# Patient Record
Sex: Female | Born: 1980 | Race: White | Hispanic: No | Marital: Married | Smoking: Never smoker
Health system: Southern US, Community
[De-identification: ages and names within clinical notes are randomized; demographics above are authoritative.]

## PROBLEM LIST (undated history)

## (undated) DIAGNOSIS — E282 Polycystic ovarian syndrome: Secondary | ICD-10-CM

## (undated) DIAGNOSIS — N809 Endometriosis, unspecified: Secondary | ICD-10-CM

## (undated) DIAGNOSIS — O3680X0 Pregnancy with inconclusive fetal viability, not applicable or unspecified: Secondary | ICD-10-CM

## (undated) HISTORY — DX: Polycystic ovarian syndrome: E28.2

## (undated) HISTORY — DX: Endometriosis, unspecified: N80.9

---
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
## 2018-05-08 ENCOUNTER — Encounter: Payer: Self-pay | Admitting: Obstetrics and Gynecology

## 2018-05-15 ENCOUNTER — Encounter: Payer: Self-pay | Admitting: Obstetrics and Gynecology

## 2018-05-15 ENCOUNTER — Other Ambulatory Visit: Payer: Self-pay

## 2018-05-15 ENCOUNTER — Ambulatory Visit (INDEPENDENT_AMBULATORY_CARE_PROVIDER_SITE_OTHER): Payer: Self-pay | Admitting: Obstetrics and Gynecology

## 2018-05-15 VITALS — BP 110/66 | HR 66 | Resp 16 | Ht 65.5 in | Wt 204.4 lb

## 2018-05-15 DIAGNOSIS — Z319 Encounter for procreative management, unspecified: Secondary | ICD-10-CM

## 2018-05-15 DIAGNOSIS — N760 Acute vaginitis: Secondary | ICD-10-CM

## 2018-05-15 DIAGNOSIS — N926 Irregular menstruation, unspecified: Secondary | ICD-10-CM

## 2018-05-15 LAB — POCT URINE PREGNANCY: Preg Test, Ur: NEGATIVE

## 2018-05-15 MED ORDER — NYSTATIN-TRIAMCINOLONE 100000-0.1 UNIT/GM-% EX CREA: 1.0000 "application " | TOPICAL_CREAM | Freq: Two times a day (BID) | CUTANEOUS | 0 refills | Status: DC

## 2018-05-15 MED ORDER — FLUCONAZOLE 150 MG PO TABS: 150.0000 mg | ORAL_TABLET | Freq: Once | ORAL | 0 refills | Status: AC

## 2018-05-15 NOTE — Progress Notes (Signed)
38 y.o. [id number] Married Caucasian/Brazilian female here for vaginal discharge.   Trying for pregnancy.  Stopped birth control one year ago. Can be 40 days in between cycles.  No cramping.  No breast tenderness prior to last menses.  Can have PMS.   One son died in neonatal period of 15 days.  Blocked urethra. One living son about 3 years ago.   She received fertility care in the past, but did conceive spontaneously.  Hx of 2 spontaneous abortions.  She had a work up and had a normal TSH and prolactin.  Her husband did have an SA which was normal.   Has painful penetration with intercourse.   Has vaginal discharge.  2 months ago had vaginal itching and this resolved with over the counter hydrocortisone.  No vaginal odor.   She and her husband are trying to move here if he is able to find work.  From [place], [place]. 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   UPT:Neg  PCP: None  Patient's last menstrual period was 03/27/2018 (exact date).     Period Pattern: (!) Irregular Menstrual Flow: Moderate Menstrual Control: Maxi pad Dysmenorrhea: None     Sexually active: Yes.   female The current method of family planning is none.    Exercising: No.  The patient does not participate in regular exercise at present. Smoker:  no  Health Maintenance: Pap:  2018 normal per patient History of abnormal Pap:  no MMG:  2016 normal per patient in [place] Colonoscopy:  n/a BMD:   n/a  Result  n/a TDaP: 2011 Gardasil:   unsure HIV: Neg per patient Hep C:Neg per patient Screening Labs:  ---   reports that she has never smoked. She has never used smokeless tobacco. She reports previous alcohol use. She reports that she does not use drugs.  Past Medical History:  Diagnosis Date  . Endometriosis    patient states has hx of endometriosis  . PCOS (polycystic ovarian syndrome)    PCOS    Past Surgical History:  Procedure Laterality Date  . CESAREAN SECTION  2000    Current Outpatient Medications   Medication Sig Dispense Refill  . Prenatal Vit-Fe Fumarate-FA (PRENATAL VITAMINS PO) Take 1 tablet by mouth daily.     No current facility-administered medications for this visit.     Family History  Problem Relation Age of Onset  . Hyperlipidemia Mother   . Thyroid disease Mother        hypothyroid  . Diabetes Father   . Hypertension Father   . Heart attack Father 6       dec from heart attack  . Thyroid disease Brother        hypothyroid    Review of Systems  All other systems reviewed and are negative.   Exam:   BP 110/66 (BP Location: Right Arm, Patient Position: Sitting, Cuff Size: Large)   Pulse 66   Resp 16   Ht 5' 5.5" (1.664 m)   Wt 204 lb 6.4 oz (92.7 kg)   LMP 03/27/2018 (Exact Date)   BMI 33.50 kg/m     General appearance: alert, cooperative and appears stated age Head: Normocephalic, without obvious abnormality, atraumatic Lungs: clear to auscultation bilaterally Heart: regular rate and rhythm Abdomen: soft, non-tender; no masses, no organomegaly Extremities: extremities normal, atraumatic, no cyanosis or edema Skin: Skin color, texture, turgor normal. No rashes or lesions No abnormal inguinal nodes palpated Neurologic: Grossly normal  Pelvic: External genitalia: erythema of the vulva.  Urethra:  normal appearing urethra with no masses, tenderness or lesions              Bartholins and Skenes: normal                 Vagina: normal appearing vagina with normal color and white slightly clumpy discharge, no lesions              Cervix: no lesions         Bimanual Exam:  Uterus:  normal size, contour, position, consistency, mobility, non-tender              Adnexa: no mass, fullness, tenderness            Chaperone was present for exam.  Assessment:    Hx endometriosis.  Hx PCOS.  Hx C/S. Vulvovaginitis. Desire for pregnancy.  Irregular menses.  Plan:   Affirm.  Diflucan and Nystatin. We discussed risk factors for yeast  infections.  We reviewed ovulation monitoring with LH kits.  We discussed fertility care, which she currently declines.  Continue PNV. FU prn.    ___30____ minutes face to face time of which over 50% was spent in counseling.    After visit summary provided.

## 2018-05-15 NOTE — Patient Instructions (Signed)
Vaginite Vaginitis A vaginite  uma inflamao da vagina. Ela  causada mais frequentemente por uma alterao do equilbrio normal de bactrias e leveduras que vivem na vagina. Essa alterao do equilbrio causa um crescimento excessivo de determinadas bactrias ou leveduras, o que causa a inflamao. H diferentes tipos de vaginite, mas os tipos mais comuns so:  Vaginose bacteriana.  Infeco por levedura (candidase).  Vaginite por tricomonase. Esta  uma infeco sexualmente transmissvel (IST).  Vaginite viral.  Vaginite atrfica.  Vaginite alrgica. Quais so as causas? A causa depende do tipo de vaginite. A vaginite pode ser causada por:  Bactrias (vaginose bacteriana).  Leveduras (infeco por levedura).  Um parasita (vaginite por tricomonase)  Um vrus (vaginite viral).  Baixos nveis hormonais (vaginite atrfica). Baixos nveis hormonais podem ocorrer durante a gravidez, a amamentao ou aps a menopausa.  Substncias irritantes, como banhos de espuma, absorventes com fragrncia e sprays femininos (vaginite alrgica). Outros fatores podem alterar o equilbrio normal das leveduras e bactrias que vivem na vagina. Eles incluem:  Medicamentos antibiticos.  Higiene insuficiente.  Diafragmas, esponjas vaginais, espermicidas, plulas anticoncepcionais e dispositivos intrauterinos (DIUs).  Relaes sexuais.  Infeco.  Diabetes descontrolada.  Um sistema imune enfraquecido. Quais so os sinais ou sintomas? Os sintomas podem variar dependendo da causa da vaginite. Os sintomas comuns incluem:  Corrimento vaginal anormal. ? O corrimento  branco, cinza ou amarelado na vaginose bacteriana. ? O corrimento  espesso, branco e semelhante a queijo na infeco por levedura. ? O corrimento  espumoso e amarelado ou esverdeado na tricomonase.  Odor forte na vagina. ? O odor lembra o odor de peixe na vaginose bacteriana.  Coceira, dor ou inchao na vagina.  Dor  durante as relaes sexuais.  Dor ou ardor ao urinar. Algumas vezes, no h sintomas. Como esse quadro clnico  tratado? O tratamento varia dependendo do tipo de infeco.  A vaginose bacteriana e a tricomonase so frequentemente tratadas com cremes ou plulas antibiticas.  Infeces por levedura so frequentemente tratadas com medicamentos antifngicos, como cremes ou supositrios vaginais.  A vaginite viral no tem cura, mas os sintomas podem ser tratados com medicamentos que aliviam o desconforto. Seu parceiro ou sua parceira sexual tambm deve receber tratamento.  A vaginite atrfica pode ser tratada com creme, plula, supositrio ou anel vaginal com estrognio. Caso ocorra secura vaginal, lubrificantes e cremes umidificantes podero ajudar. Voc pode ser instruda a evitar sabonetes, sprays ou duchas vaginais com fragrncia.  O tratamento da vaginite alrgica inclui interromper o uso do produto que est causando o problema. Cremes vaginais podem ser usados no tratamento dos sintomas. Siga essas instrues em casa:  Tome todos os medicamentos de acordo com as orientaes do seu mdico.  Mantenha a rea genital limpa e seca. Evite sabonete e somente enxgue a rea com gua.  Evite duchas vaginais. Duchas vaginais podem remover bactrias saudveis da vagina.  No use absorventes internos nem tenha relaes sexuais at a vaginite ter sido tratada. Use absorventes externos se apresentar vaginite.  Limpe-se da frente para trs. Isso evita a passagem de bactrias do reto para a vagina.  Areje a rea genital. ? Use roupa de baixo de algodo para reduzir o acmulo de umidade. ? Evite usar roupa de baixo ao dormir at a vaginite desaparecer. ? Evite calas apertadas, roupas de baixo ou meias-calas de nylon sem um protetor de algodo. ? Tire roupas molhadas (especialmente trajos de banho) assim que possvel.  Use produtos suaves e sem fragrncia. Evite usar produtos irritantes, como:  ? Sprays femininos   com fragrncia. ? Amaciantes de roupa. ? Detergentes com fragrncia. ? Absorventes internos com fragrncia. ? Sabonetes ou banhos de espuma com fragrncia.  Pratique sexo seguro e use preservativos. Preservativos podem evitar a transmisso da tricomonase e da vaginite viral. Entre em contato com um mdico se:  Sentir dores abdominais.  Apresentar sintomas que durem mais de 2 a 3 dias.  Tiver febre e seus sintomas piorarem subitamente. Estas informaes no se destinam a substituir as recomendaes de seu mdico. No deixe de discutir quaisquer dvidas com seu mdico. Document Released: 12/06/2011 Document Revised: 08/22/2016 Document Reviewed: 08/22/2016 Elsevier Interactive Patient Education  2019 Elsevier Inc.  

## 2018-05-16 LAB — VAGINITIS/VAGINOSIS, DNA PROBE
Candida Species: POSITIVE — AB
Gardnerella vaginalis: NEGATIVE
Trichomonas vaginosis: NEGATIVE

## 2018-09-23 ENCOUNTER — Inpatient Hospital Stay (HOSPITAL_COMMUNITY)
Admission: AD | Admit: 2018-09-23 | Discharge: 2018-09-23 | Disposition: A | Discharge status: Home / Self Care | Payer: Self-pay | Attending: Obstetrics and Gynecology | Admitting: Obstetrics and Gynecology

## 2018-09-23 ENCOUNTER — Telehealth: Payer: Self-pay | Admitting: Obstetrics and Gynecology

## 2018-09-23 ENCOUNTER — Inpatient Hospital Stay (HOSPITAL_COMMUNITY): Payer: Self-pay

## 2018-09-23 ENCOUNTER — Other Ambulatory Visit: Payer: Self-pay

## 2018-09-23 ENCOUNTER — Encounter (HOSPITAL_COMMUNITY): Payer: Self-pay | Admitting: *Deleted

## 2018-09-23 DIAGNOSIS — Z3A09 9 weeks gestation of pregnancy: Secondary | ICD-10-CM

## 2018-09-23 DIAGNOSIS — R109 Unspecified abdominal pain: Secondary | ICD-10-CM | POA: Insufficient documentation

## 2018-09-23 DIAGNOSIS — O3680X0 Pregnancy with inconclusive fetal viability, not applicable or unspecified: Secondary | ICD-10-CM

## 2018-09-23 DIAGNOSIS — O26891 Other specified pregnancy related conditions, first trimester: Secondary | ICD-10-CM | POA: Insufficient documentation

## 2018-09-23 LAB — COMPREHENSIVE METABOLIC PANEL
ALT: 14 U/L (ref 0–44)
AST: 17 U/L (ref 15–41)
Albumin: 3.8 g/dL (ref 3.5–5.0)
Alkaline Phosphatase: 42 U/L (ref 38–126)
Anion gap: 10 (ref 5–15)
BUN: 6 mg/dL (ref 6–20)
CO2: 23 mmol/L (ref 22–32)
Calcium: 9.5 mg/dL (ref 8.9–10.3)
Chloride: 106 mmol/L (ref 98–111)
Creatinine, Ser: 0.67 mg/dL (ref 0.44–1.00)
GFR calc Af Amer: >60 mL/min (ref >60)
GFR calc non Af Amer: >60 mL/min (ref >60)
Glucose, Bld: 101 mg/dL — ABNORMAL HIGH (ref 70–99)
Potassium: 4.5 mmol/L (ref 3.5–5.1)
Sodium: 139 mmol/L (ref 135–145)
Total Bilirubin: 0.5 mg/dL (ref 0.3–1.2)
Total Protein: 7.3 g/dL (ref 6.5–8.1)

## 2018-09-23 LAB — URINALYSIS, ROUTINE W REFLEX MICROSCOPIC
Bilirubin Urine: NEGATIVE
Glucose, UA: NEGATIVE mg/dL
Ketones, ur: NEGATIVE mg/dL
Leukocytes,Ua: NEGATIVE
Nitrite: NEGATIVE
Protein, ur: NEGATIVE mg/dL
Specific Gravity, Urine: 1.002 — ABNORMAL LOW (ref 1.005–1.030)
pH: 8 (ref 5.0–8.0)

## 2018-09-23 LAB — CBC
HCT: 38.3 % (ref 36.0–46.0)
Hemoglobin: 13 g/dL (ref 12.0–15.0)
MCH: 29.7 pg (ref 26.0–34.0)
MCHC: 33.9 g/dL (ref 30.0–36.0)
MCV: 87.6 fL (ref 80.0–100.0)
Platelets: 213 10*3/uL (ref 150–400)
RBC: 4.37 MIL/uL (ref 3.87–5.11)
RDW: 12.2 % (ref 11.5–15.5)
WBC: 6.1 10*3/uL (ref 4.0–10.5)
nRBC: 0 % (ref 0.0–0.2)

## 2018-09-23 LAB — ABO/RH: ABO/RH(D): A POS

## 2018-09-23 LAB — HCG, QUANTITATIVE, PREGNANCY: hCG, Beta Chain, Quant, S: 7881 m[IU]/mL — ABNORMAL HIGH (ref <5)

## 2018-09-23 LAB — WET PREP, GENITAL
Sperm: NONE SEEN
Trich, Wet Prep: NONE SEEN
Yeast Wet Prep HPF POC: NONE SEEN

## 2018-09-23 NOTE — Discharge Instructions (Signed)
Ectopic Pregnancy  An ectopic pregnancy happens when a fertilized egg grows outside the womb (uterus). The fertilized egg cannot stay alive outside of the womb. This problem often happens in a fallopian tube. It is often caused by damage to the tube. If this problem is found early, you may be treated with medicine that stops the egg from growing. If your tube tears or bursts open (ruptures), you will bleed inside. Often, there is very bad pain in the lower belly. This is an emergency. You will need surgery. Get help right away. Follow these instructions at home: After being treated with medicine or surgery:  Rest and limit your activity for as long as told by your doctor.  Until your doctor says that it is safe: ? Do not lift anything that is heavier than 10 lb (4.5 kg) or the limit that your doctor tells you. ? Avoid exercise and any movement that takes a lot of effort.  To prevent problems when pooping (constipation): ? Eat a healthy diet. This includes:  Fruits.  Vegetables.  Whole grains. ? Drink 6-8 glasses of water a day. Contact a doctor if: Get help right away if:  You have sudden and very bad pain in your belly.  You have very bad pain in your shoulders or neck.  You have pain that gets worse and is not helped by medicine.  You have: ? A fever or chills. ? Vaginal bleeding. ? Redness or swelling at the site of a surgical cut (incision).  You feel sick to your stomach (nauseous) or you throw up (vomit).  You feel dizzy or weak.  You feel light-headed or you pass out (faint). Summary  An ectopic pregnancy happens when a fertilized egg grows outside the womb (uterus).  If this problem is found early, you may be treated with medicine that stops the egg from growing.  If your tube tears or bursts open (ruptures), you will need surgery. This is an emergency. Get help right away. This information is not intended to replace advice given to you by your health care  provider. Make sure you discuss any questions you have with your health care provider. Document Released: 06/09/2008 Document Revised: 02/23/2017 Document Reviewed: 04/06/2016 Elsevier Patient Education  2020 Elsevier Inc.  

## 2018-09-23 NOTE — Telephone Encounter (Signed)
Patient notified to go to the [facility] at [facility] for care. She voiced understanding and will go.

## 2018-09-23 NOTE — Telephone Encounter (Signed)
I recommend she go to the [facility] at [facility] for care. We do not have ultrasound services here today.

## 2018-09-23 NOTE — Telephone Encounter (Signed)
Patient just found out she's pregnant and over the weekend started cramping and noticed brown spotting.

## 2018-09-23 NOTE — MAU Note (Signed)
.   Tonya [name] is a 38 y.o. at [redacted]w[redacted]d here in MAU reporting: lower abdominal cramping with brown bleeding since Saturday.  LMP: 07/20/18 Onset of complaint: Saturday Pain score: 3 Vitals:   09/23/18 1119  BP: 134/61  Pulse: (!) 114  Resp: 16  Temp: 98 F (36.7 C)      Lab orders placed from triage:

## 2018-09-23 NOTE — MAU Provider Note (Addendum)
Patient Tonya [name] is a 38 y.o. [id number] At [redacted]w[redacted]d here with complaints of abdominal pain and bleeding for the past two days. She denies fever, SOB, dysuria, back ache, NV or other ob-gyn complaints.   Her ob history is significant for one c-section 2000, NSVD 2017. She has also had two 1st trimester miscarriages in 2012 and 2014.   She has been trying to start prenatal care but has been unable to start care due [place]. When she talked to Dr. Elza Rafter office today and explained her complaints, her office told her to come here.   Patient had a beta HCG drawn on 09-16-2018 at [facility] and it was 3531.    History     CSN: [id number]  Arrival date and time: 09/23/18 1104   None     Chief Complaint  Patient presents with  . Vaginal Bleeding  . Abdominal Pain   Abdominal Pain This is a new problem. The current episode started in the past 7 days. The problem occurs intermittently. The problem has been unchanged. The pain is located in the suprapubic region. The pain is at a severity of 3/10. Pertinent negatives include no constipation, diarrhea, frequency, nausea or vomiting. The pain is relieved by being still.  Vaginal Bleeding The patient's primary symptoms include vaginal bleeding. This is a new problem. The current episode started in the past 7 days. Episode frequency: she noticed dark brown blood when she went to the bathroom over the weekend; today it is a little bit more.   Associated symptoms include abdominal pain. Pertinent negatives include no back pain, chills, constipation, diarrhea, frequency, nausea, sore throat, urgency or vomiting. The vaginal discharge was bloody and brown. She has not been passing clots. She has not been passing tissue.    OB History    Gravida  5   Para  2   Term  0   Preterm  2   AB  2   Living  2     SAB  2   TAB      Ectopic      Multiple      Live Births  1           Past Medical History:   Diagnosis Date  . Endometriosis    patient states has hx of endometriosis  . PCOS (polycystic ovarian syndrome)    PCOS    Past Surgical History:  Procedure Laterality Date  . CESAREAN SECTION  2000    Family History  Problem Relation Age of Onset  . Hyperlipidemia Mother   . Thyroid disease Mother        hypothyroid  . Diabetes Father   . Hypertension Father   . Heart attack Father 13       dec from heart attack  . Thyroid disease Brother        hypothyroid    Social History   Tobacco Use  . Smoking status: Never Smoker  . Smokeless tobacco: Never Used  Substance Use Topics  . Alcohol use: Not Currently    Frequency: Never  . Drug use: Never    Allergies: No Known Allergies  Medications Prior to Admission  Medication Sig Dispense Refill Last Dose  . Prenatal Vit-Fe Fumarate-FA (PRENATAL VITAMINS PO) Take 1 tablet by mouth daily.   09/23/2018 at Unknown time  . nystatin-triamcinolone (MYCOLOG II) cream Apply 1 application topically 2 (two) times daily. Apply to affected area BID for up to 7  days. 60 g 0 09/16/2018    Review of Systems  Constitutional: Negative for chills.  HENT: Negative for sore throat.   Gastrointestinal: Positive for abdominal pain. Negative for constipation, diarrhea, nausea and vomiting.  Genitourinary: Positive for vaginal bleeding. Negative for frequency and urgency.  Musculoskeletal: Negative for back pain.   Physical Exam   Blood pressure 134/61, pulse (!) 114, temperature 98 F (36.7 C), resp. rate 16, height 5\' 3"  (1.6 m), weight 88.5 kg, last menstrual period 07/20/2018.  Physical Exam  Constitutional: She appears well-developed.  HENT:  Head: Normocephalic.  Neck: Normal range of motion.  Respiratory: Effort normal.  GI: Soft.  Genitourinary:    Vagina normal.     Genitourinary Comments: NEFG; dark red brown blood present in the vagina, no clots or tissue. No odor; no CMT, suprapubic or adnexal tenderness.     Musculoskeletal: Normal range of motion.  Neurological: She is alert.  Skin: Skin is warm and dry.  Psychiatric: She has a normal mood and affect.    MAU Course  Procedures  MDM -Full ectopic work up performed:  -Beta hcg is 7800; only 50% rise in 7 days. US shows gestational sac but no YS -Wet prep positive for clue but patient is asymptomatic so will defer treatment in first trimester.   Assessment and Plan    1. Pregnancy of unknown anatomic location   2. Abdominal pain     -Explained to patient that she is most likely miscarrying, given that she had a beta last week of 3500 and a beta today of 7880. Patient strongly desires this pregnancy and would like to continue expectant management. Explained as well that we consider this a pregnancy of unknown location and will need to follow-up appropriately. Strict ectopic precautions given. -Lab appt given for three days from now; explained visitor restrictions.  -All questions answered; patient stable for discharge.  Charlesetta GaribaldiKathryn Lorraine  09/23/2018, 12:06 PM

## 2018-09-23 NOTE — Telephone Encounter (Signed)
Spoke with patient. She states LMP 07-20-18 and had Pos. Home UPT. She began cramping over weekend and is having brown spotting. Will discuss with Dr.Silva and call back with further recommendations. Routed to provider.

## 2018-09-24 LAB — GC/CHLAMYDIA PROBE AMP (~~LOC~~) NOT AT ARMC
Chlamydia: NEGATIVE
Neisseria Gonorrhea: NEGATIVE

## 2018-09-25 ENCOUNTER — Telehealth: Payer: Self-pay | Admitting: Family Medicine

## 2018-09-25 NOTE — Telephone Encounter (Signed)
Spoke with patient about her appointment on 7/2 @ 11:00. Patient was instructed to wear a face mask and no visitors are allowed with her. Patient was screened for covid symptoms and denied having any

## 2018-09-25 NOTE — Telephone Encounter (Signed)
Patient called and stated she no longer needed this appointment to come in for lab work. She stated she had passed some blood clots, and was only bleeding a little. After speaking to [place], she instructed me to inform the patient that she still needs to come in to make sure her levels are down to 0. Patient stated she understood.

## 2018-09-26 ENCOUNTER — Ambulatory Visit (INDEPENDENT_AMBULATORY_CARE_PROVIDER_SITE_OTHER): Payer: Self-pay | Admitting: General Practice

## 2018-09-26 ENCOUNTER — Other Ambulatory Visit: Payer: Self-pay

## 2018-09-26 ENCOUNTER — Telehealth: Payer: Self-pay | Admitting: General Practice

## 2018-09-26 DIAGNOSIS — O3680X0 Pregnancy with inconclusive fetal viability, not applicable or unspecified: Secondary | ICD-10-CM

## 2018-09-26 LAB — BETA HCG QUANT (REF LAB): hCG Quant: 707 m[IU]/mL

## 2018-09-26 NOTE — Telephone Encounter (Signed)
Called patient and informed her of bhcg questions. Discussed our doctor is recommending weekly quants to 0 as a pregnancy was never visualized somewhere. She is welcome to come to our office for that or follow up with her doctor. Patient verbalized understanding & states she will follow up with Dr Quincy Simmonds. Patient had no questions.

## 2018-09-26 NOTE — Progress Notes (Signed)
Patient presents to office today for stat bhcg following recent MAU visit on 6/29. Patient reports bleeding has turned red and is similar to a period since her visit. Patient also reports cramping like a period as well but denies pain at this time. Discussed with patient we are monitoring your bhcg levels today- when results come back they will be reviewed with a provider and we will contact her via telephone with results/updated plan of care. Patient verbalized understanding and provided call back number [phone].  Reviewed results with Dr Ilda Basset who finds decreasing bhcg levels indicative of SAB. Patient should have weekly quants to follow down to 0- patient can also follow up with her doctor if she desires. Will call patient with results.  Koren Bound RN BSN 09/26/18

## 2018-10-09 NOTE — Progress Notes (Signed)
Patient seen and assessed by nursing staff during this encounter. I have reviewed the chart and agree with the documentation and plan.  Jamile Rekowski, MD 10/09/2018 2:33 PM    

## 2018-12-20 ENCOUNTER — Telehealth: Payer: Self-pay | Admitting: Obstetrics and Gynecology

## 2018-12-20 NOTE — Telephone Encounter (Signed)
Left message to call Zamora Colton, RN at [facility] [phone].   

## 2018-12-20 NOTE — Telephone Encounter (Signed)
Patient returned call

## 2018-12-20 NOTE — Telephone Encounter (Signed)
Patient sent the following appointment request  through [facility]. Routing to triage to assist patient.  Appointment Request From: Tonya [name]  With Provider: Arloa Koh, MD [facility]]  Preferred Date Range: 12/20/2018 - 12/25/2018  Preferred Times: Any Time  Reason for visit: Office Visit  Comments: I used a medication back in [place] and I dont have the prescription to buy it here, so I need to talk with Dr. Dietrich Pates about it.

## 2018-12-20 NOTE — Telephone Encounter (Signed)
Spoke with patient. Patient request OV to further discuss RX for progesterone for irregular menses. Was previously prescribed in [place]. Patient denies any other GYN concerns or symptoms, "would prefer to discuss with Dr. Quincy Simmonds". Covid 19 prescreen negative, precautions reviewed. OV scheduled for 9/28 at 4:30pm with Dr. Quincy Simmonds.   Routing to provider for final review. Patient is agreeable to disposition. Will close encounter.

## 2018-12-23 ENCOUNTER — Ambulatory Visit (INDEPENDENT_AMBULATORY_CARE_PROVIDER_SITE_OTHER): Payer: Self-pay | Admitting: Obstetrics and Gynecology

## 2018-12-23 ENCOUNTER — Encounter: Payer: Self-pay | Admitting: Obstetrics and Gynecology

## 2018-12-23 ENCOUNTER — Other Ambulatory Visit: Payer: Self-pay

## 2018-12-23 VITALS — BP 120/72 | HR 100 | Temp 98.0°F | Ht 65.5 in | Wt 198.4 lb

## 2018-12-23 DIAGNOSIS — Z3490 Encounter for supervision of normal pregnancy, unspecified, unspecified trimester: Secondary | ICD-10-CM

## 2018-12-23 DIAGNOSIS — Z3201 Encounter for pregnancy test, result positive: Secondary | ICD-10-CM

## 2018-12-23 NOTE — Progress Notes (Signed)
GYNECOLOGY  VISIT   HPI: 38 y.o.   Married  Caucasian/Brazilian  female   [phone] with Patient's last menstrual period was 11/12/2018 (exact date). here for irregular cycles.  Patient did have a positive BHCG on 12-20-18 with [facility].  Level was 7243.  No bleeding and no pain.  She had some cramping but this resolved.  A little nausea.   Patient had a miscarriage in 09/2018.  Hx of SABs.  She used progesterone in the past in [place].   Patient and her family are uncertain if they will remain in the [place].  They may have an answer to this by November.   GYNECOLOGIC HISTORY: Patient's last menstrual period was 11/12/2018 (exact date). Contraception: Trying for pregnancy Menopausal hormone therapy:  none Last mammogram: 2016 normal per patient in [place] Last pap smear: 2018 normal per patient        OB History    Gravida  6   Para  2   Term  0   Preterm  2   AB  2   Living  2     SAB  2   TAB      Ectopic      Multiple      Live Births  1              Patient Active Problem List   Diagnosis Date Noted  . Pregnancy of unknown anatomic location 09/23/2018    Past Medical History:  Diagnosis Date  . Endometriosis    patient states has hx of endometriosis  . PCOS (polycystic ovarian syndrome)    PCOS  . Pregnancy of unknown anatomic location 09/23/2018    Past Surgical History:  Procedure Laterality Date  . CESAREAN SECTION  2000    Current Outpatient Medications  Medication Sig Dispense Refill  . Prenatal Vit-Fe Fumarate-FA (PRENATAL VITAMINS PO) Take 1 tablet by mouth daily.     No current facility-administered medications for this visit.      ALLERGIES: Patient has no known allergies.  Family History  Problem Relation Age of Onset  . Hyperlipidemia Mother   . Thyroid disease Mother        hypothyroid  . Diabetes Father   . Hypertension Father   . Heart attack Father 33       dec from heart attack  . Thyroid disease Brother    hypothyroid    Social History   Socioeconomic History  . Marital status: Married    Spouse name: Not on file  . Number of children: Not on file  . Years of education: Not on file  . Highest education level: Not on file  Occupational History  . Not on file  Social Needs  . Financial resource strain: Not on file  . Food insecurity    Worry: Not on file    Inability: Not on file  . Transportation needs    Medical: Not on file    Non-medical: Not on file  Tobacco Use  . Smoking status: Never Smoker  . Smokeless tobacco: Never Used  Substance and Sexual Activity  . Alcohol use: Not Currently    Frequency: Never  . Drug use: Never  . Sexual activity: Yes    Birth control/protection: None    Comment: trying for pregnancy  Lifestyle  . Physical activity    Days per week: Not on file    Minutes per session: Not on file  . Stress: Not on file  Relationships  .  Social Musician on phone: Not on file    Gets together: Not on file    Attends religious service: Not on file    Active member of club or organization: Not on file    Attends meetings of clubs or organizations: Not on file    Relationship status: Not on file  . Intimate partner violence    Fear of current or ex partner: Not on file    Emotionally abused: Not on file    Physically abused: Not on file    Forced sexual activity: Not on file  Other Topics Concern  . Not on file  Social History Narrative  . Not on file    Review of Systems  All other systems reviewed and are negative.   PHYSICAL EXAMINATION:    BP 120/72 (Cuff Size: Large)   Pulse 100   Temp 98 F (36.7 C) (Temporal)   Ht 5' 5.5" (1.664 m)   Wt 198 lb 6.4 oz (90 kg)   LMP 11/12/2018 (Exact Date)   BMI 32.51 kg/m     General appearance: alert, cooperative and appears stated age   ASSESSMENT  5+ 6 weeks.  Hx SAB.  PLAN  We talked about early pregnancy care - avoidance of unnecessary exposures - medication/tobacco/ETOH,  importance of physical activity with control of heart rate, avoidance of litter boxes, dietary recommendations given.  She will establish care with the teaching service and pursue any appropriate medical therapy under their direction.    An After Visit Summary was printed and given to the patient.  __15____ minutes face to face time of which over 50% was spent in counseling.

## 2018-12-24 ENCOUNTER — Telehealth: Payer: Self-pay | Admitting: Medical

## 2018-12-24 NOTE — Telephone Encounter (Signed)
The patient stated she was using progestrol with the last pregnancy and she would like to know if she should use it for this one as well and if so can we prescribe it for her. She stated she has already had 3 miscarriages. Would like a call back to discuss what she should do.

## 2018-12-24 NOTE — Telephone Encounter (Signed)
The patient stated she might be moving to another city and does not want to schedule the in office visit until she can verify for sure she will still be here.

## 2018-12-26 NOTE — Telephone Encounter (Signed)
Discussed with Dr Gala Romney who states there isn't enough evidence to show progesterone is effective in reducing likelihood of SAB. Patient would also need to see provider to discuss as well. Called patient and discussed with her. Patient verbalized understanding & states she understands but she has had miscarriages in the past and was on progesterone in one of her pregnancies and felt like it helped. Patient states her appt is in November and that is too far away, she feels she needs to be seen now. Offered she may call our [facility] office to see if they have a sooner appt. Patient verbalized understanding & had no questions.

## 2019-01-22 ENCOUNTER — Encounter: Payer: Self-pay | Admitting: *Deleted

## 2019-01-28 ENCOUNTER — Other Ambulatory Visit: Payer: Self-pay

## 2019-01-28 ENCOUNTER — Ambulatory Visit: Payer: Self-pay | Admitting: *Deleted

## 2019-01-28 DIAGNOSIS — O0990 Supervision of high risk pregnancy, unspecified, unspecified trimester: Secondary | ICD-10-CM | POA: Insufficient documentation

## 2019-01-28 NOTE — Progress Notes (Signed)
0928 I called Tonya [name] and she verified her name and DOB. She states she forgot to cancel the appointment. I asked if she wanted to reschedule or cancel completely.  She states she wants to cancel completely. I asked if I may ask why and she states she can't do visit by telephone. I explained only first visit is phone and then next is in office. She then states she is going to another provider. I informed her I will notify front office to cancel. Nilaya Bouie,RN

## 2019-03-25 LAB — OB RESULTS CONSOLE GC/CHLAMYDIA
Chlamydia: NEGATIVE
Gonorrhea: NEGATIVE

## 2019-03-25 LAB — OB RESULTS CONSOLE RPR: RPR: NONREACTIVE

## 2019-03-25 LAB — OB RESULTS CONSOLE PLATELET COUNT: Platelets: 193

## 2019-03-25 LAB — CYTOLOGY - PAP: Pap: NEGATIVE

## 2019-03-25 LAB — OB RESULTS CONSOLE HGB/HCT, BLOOD
HCT: 33 (ref 29–41)
Hemoglobin: 11

## 2019-03-25 LAB — OB RESULTS CONSOLE HIV ANTIBODY (ROUTINE TESTING): HIV: NONREACTIVE

## 2019-03-25 LAB — GLUCOSE TOLERANCE, 1 HOUR: Glucose, 1 Hour GTT: 79

## 2019-03-26 ENCOUNTER — Other Ambulatory Visit (HOSPITAL_COMMUNITY): Payer: Self-pay | Admitting: Nurse Practitioner

## 2019-03-26 DIAGNOSIS — Z3A22 22 weeks gestation of pregnancy: Secondary | ICD-10-CM

## 2019-03-26 DIAGNOSIS — O09299 Supervision of pregnancy with other poor reproductive or obstetric history, unspecified trimester: Secondary | ICD-10-CM

## 2019-03-26 DIAGNOSIS — O09522 Supervision of elderly multigravida, second trimester: Secondary | ICD-10-CM

## 2019-03-26 DIAGNOSIS — Z363 Encounter for antenatal screening for malformations: Secondary | ICD-10-CM

## 2019-03-26 DIAGNOSIS — O09899 Supervision of other high risk pregnancies, unspecified trimester: Secondary | ICD-10-CM

## 2019-03-26 LAB — OB RESULTS CONSOLE ABO/RH: RH Type: POSITIVE

## 2019-03-26 LAB — OB RESULTS CONSOLE VARICELLA ZOSTER ANTIBODY, IGG: Varicella: IMMUNE

## 2019-03-26 LAB — OB RESULTS CONSOLE ANTIBODY SCREEN: Antibody Screen: NEGATIVE

## 2019-03-26 LAB — OB RESULTS CONSOLE HEPATITIS B SURFACE ANTIGEN: Hepatitis B Surface Ag: NEGATIVE

## 2019-03-28 NOTE — L&D Delivery Note (Signed)
Jolane Bankhead is a 40 y.o. female [phone] with IUP at [redacted]w[redacted]d admitted for SOL .  She progressed without augmentation to complete and pushed 2 times to deliver.  Cord clamping delayed by several minutes then clamped by CNM and cut by FOB.    Delivery Note At 3:27 PM a viable female was delivered via VBAC, Spontaneous (Presentation: Left Occiput Transverse).  APGAR: 8, 9; weight  pending.   Placenta status: Spontaneous, Intact.  Cord: 3 vessels with the following complications: None.  Anesthesia: Epidural Episiotomy: None Lacerations: 2nd degree;Perineal Suture Repair: 3.0 vicryl Est. Blood Loss (mL):  50  Mom to postpartum.  Baby to Couplet care / Skin to Skin.  Rolm Bookbinder CNM 08/09/2019, 3:46 PM

## 2019-04-01 DIAGNOSIS — O0990 Supervision of high risk pregnancy, unspecified, unspecified trimester: Secondary | ICD-10-CM

## 2019-04-02 DIAGNOSIS — O0990 Supervision of high risk pregnancy, unspecified, unspecified trimester: Secondary | ICD-10-CM

## 2019-04-02 HISTORY — DX: Supervision of high risk pregnancy, unspecified, unspecified trimester: O09.90

## 2019-04-03 ENCOUNTER — Encounter: Payer: Self-pay | Admitting: *Deleted

## 2019-04-07 ENCOUNTER — Encounter: Payer: Self-pay | Admitting: Family Medicine

## 2019-04-07 ENCOUNTER — Ambulatory Visit (INDEPENDENT_AMBULATORY_CARE_PROVIDER_SITE_OTHER): Payer: Medicaid Other | Admitting: Family Medicine

## 2019-04-07 ENCOUNTER — Other Ambulatory Visit: Payer: Self-pay

## 2019-04-07 VITALS — BP 115/67 | HR 93 | Wt 204.6 lb

## 2019-04-07 DIAGNOSIS — O09299 Supervision of pregnancy with other poor reproductive or obstetric history, unspecified trimester: Secondary | ICD-10-CM

## 2019-04-07 DIAGNOSIS — O0992 Supervision of high risk pregnancy, unspecified, second trimester: Secondary | ICD-10-CM | POA: Diagnosis not present

## 2019-04-07 DIAGNOSIS — Z98891 History of uterine scar from previous surgery: Secondary | ICD-10-CM | POA: Insufficient documentation

## 2019-04-07 DIAGNOSIS — O09292 Supervision of pregnancy with other poor reproductive or obstetric history, second trimester: Secondary | ICD-10-CM | POA: Diagnosis not present

## 2019-04-07 DIAGNOSIS — O0990 Supervision of high risk pregnancy, unspecified, unspecified trimester: Secondary | ICD-10-CM

## 2019-04-07 DIAGNOSIS — Z3A20 20 weeks gestation of pregnancy: Secondary | ICD-10-CM | POA: Diagnosis not present

## 2019-04-07 HISTORY — DX: Supervision of pregnancy with other poor reproductive or obstetric history, unspecified trimester: O09.299

## 2019-04-07 HISTORY — DX: History of uterine scar from previous surgery: Z98.891

## 2019-04-07 MED ORDER — PROGESTERONE MICRONIZED 200 MG PO CAPS: ORAL_CAPSULE | ORAL | 3 refills | Status: DC

## 2019-04-07 MED ORDER — FLUCONAZOLE 150 MG PO TABS: 150.0000 mg | ORAL_TABLET | Freq: Once | ORAL | 0 refills | Status: AC

## 2019-04-07 NOTE — Progress Notes (Signed)
VBAC consent signed at today's visit.   Fleet Contras RN 04/07/19

## 2019-04-07 NOTE — Progress Notes (Signed)
Subjective:  Tonya [name] is a [id number] [redacted]w[redacted]d being seen today for her first obstetrical visit in our office. She was referred here from [facility]. She was referred her due to history of preterm delivery. She was on vaginal progesterone during that pregnancy due to 2 first trimester losses. She had a successful VBAC with that delivery. Her first pregnancy was complicated by urinary tract obstruction, leading to oligohydramnios and kidney failure. The baby was delivered by cesarean section and the baby died 2 weeks later. The patient is currently on prometrium, prescribed by her OB provider in [place]. Patient does intend to breast feed. Pregnancy history fully reviewed.  Patient reports no complaints.  BP 115/67   Pulse 93   Wt 204 lb 9.6 oz (92.8 kg)   LMP 11/12/2018 (Exact Date)   BMI 34.58 kg/m   HISTORY: OB History  Gravida Para Term Preterm AB Living  6 2 0 2 3 0  SAB TAB Ectopic Multiple Live Births  3       1    # Outcome Date GA Lbr Len/2nd Weight Sex Delivery Anes PTL Lv  6 Current           5 Preterm 05/25/15 [redacted]w[redacted]d  6 lb 5.4 oz (2.875 kg) M Vag-Spont EPI    4 Preterm 08/16/98 [redacted]w[redacted]d   M CS-LTranv Spinal  ND     Birth Comments: Baby deceased after 29-Jul-2022 days--kidney abnormalities  3 SAB           2 SAB           1 SAB             Past Medical History:  Diagnosis Date  . Endometriosis    patient states has hx of endometriosis  . PCOS (polycystic ovarian syndrome)    PCOS  . Pregnancy of unknown anatomic location 09/23/2018    Past Surgical History:  Procedure Laterality Date  . CESAREAN SECTION  2000    Family History  Problem Relation Age of Onset  . Hyperlipidemia Mother   . Thyroid disease Mother        hypothyroid  . Depression Mother   . Diabetes Father   . Hypertension Father   . Heart attack Father 80       dec from heart attack  . Thyroid disease Brother        hypothyroid     Exam  BP 115/67   Pulse 93   Wt 204 lb 9.6 oz (92.8 kg)   LMP 11/12/2018  (Exact Date)   BMI 34.58 kg/m   Chaperone present during exam  CONSTITUTIONAL: Well-developed, well-nourished female in no acute distress.  HENT:  Normocephalic, atraumatic, External right and left ear normal. Oropharynx is clear and moist EYES: Conjunctivae and EOM are normal. Pupils are equal, round, and reactive to light. No scleral icterus.  NECK: Normal range of motion, supple, no masses.  Normal thyroid.  CARDIOVASCULAR: Normal heart rate noted, regular rhythm RESPIRATORY: Clear to auscultation bilaterally. Effort and breath sounds normal, no problems with respiration noted. ABDOMEN: Soft, normal bowel sounds, no distention noted.  No tenderness, rebound or guarding.  MUSCULOSKELETAL: Normal range of motion. No tenderness.  No cyanosis, clubbing, or edema.  2+ distal pulses. SKIN: Skin is warm and dry. No rash noted. Not diaphoretic. No erythema. No pallor. NEUROLOGIC: Alert and oriented to person, place, and time. Normal reflexes, muscle tone coordination. No cranial nerve deficit noted. PSYCHIATRIC: Normal mood and affect. Normal behavior. Normal judgment and  thought content.    Assessment:    Pregnancy: [id number] Patient Active Problem List   Diagnosis Date Noted  . Preterm delivery 04/07/2019  . History of cesarean delivery 04/07/2019  . History of VBAC 04/07/2019  . Supervision of high risk pregnancy, antepartum 04/02/2019  . Pregnancy of unknown anatomic location 09/23/2018      Plan:   1. Supervision of high risk pregnancy, antepartum FHT and FH normal - CHL AMB BABYSCRIPTS SCHEDULE OPTIMIZATION  2. Preterm delivery Discussed use of Makena, newer studies showing inconclusive data regarding its efficacy. Patient already on prometrium - she would like to stay on the vaginal progesterone. I discussed with her that while prometrium is useful for shortened cervix, there is less data on its use for preterm labor prevention. However, it is considered safe.  3. History of  cesarean delivery Would like VBAC. Discussed risks. Consent signed.  4. History of VBAC  5. History of fetal anomaly in prior pregnancy, currently pregnant Has appt with MFM.       Problem list reviewed and updated. 75% of 30 min visit spent on counseling and coordination of care.     Levie Heritage 04/07/2019

## 2019-04-10 ENCOUNTER — Encounter: Payer: Self-pay | Admitting: *Deleted

## 2019-04-16 ENCOUNTER — Ambulatory Visit (HOSPITAL_COMMUNITY): Payer: Medicaid Other | Admitting: *Deleted

## 2019-04-16 ENCOUNTER — Other Ambulatory Visit (HOSPITAL_COMMUNITY): Payer: Self-pay | Admitting: *Deleted

## 2019-04-16 ENCOUNTER — Ambulatory Visit (HOSPITAL_COMMUNITY)
Admission: RE | Admit: 2019-04-16 | Discharge: 2019-04-16 | Disposition: A | Discharge status: Home / Self Care | Payer: Medicaid Other | Source: Ambulatory Visit | Attending: Nurse Practitioner | Admitting: Nurse Practitioner

## 2019-04-16 ENCOUNTER — Encounter (HOSPITAL_COMMUNITY): Payer: Self-pay

## 2019-04-16 ENCOUNTER — Other Ambulatory Visit: Payer: Self-pay

## 2019-04-16 DIAGNOSIS — O34219 Maternal care for unspecified type scar from previous cesarean delivery: Secondary | ICD-10-CM | POA: Diagnosis not present

## 2019-04-16 DIAGNOSIS — O09212 Supervision of pregnancy with history of pre-term labor, second trimester: Secondary | ICD-10-CM | POA: Diagnosis not present

## 2019-04-16 DIAGNOSIS — Z363 Encounter for antenatal screening for malformations: Secondary | ICD-10-CM | POA: Insufficient documentation

## 2019-04-16 DIAGNOSIS — O09292 Supervision of pregnancy with other poor reproductive or obstetric history, second trimester: Secondary | ICD-10-CM

## 2019-04-16 DIAGNOSIS — O09299 Supervision of pregnancy with other poor reproductive or obstetric history, unspecified trimester: Secondary | ICD-10-CM | POA: Diagnosis present

## 2019-04-16 DIAGNOSIS — O0990 Supervision of high risk pregnancy, unspecified, unspecified trimester: Secondary | ICD-10-CM

## 2019-04-16 DIAGNOSIS — O09522 Supervision of elderly multigravida, second trimester: Secondary | ICD-10-CM

## 2019-04-16 DIAGNOSIS — Z3A22 22 weeks gestation of pregnancy: Secondary | ICD-10-CM

## 2019-04-16 DIAGNOSIS — O09899 Supervision of other high risk pregnancies, unspecified trimester: Secondary | ICD-10-CM | POA: Insufficient documentation

## 2019-04-16 DIAGNOSIS — O99212 Obesity complicating pregnancy, second trimester: Secondary | ICD-10-CM

## 2019-04-29 ENCOUNTER — Encounter: Payer: Self-pay | Admitting: *Deleted

## 2019-04-30 DIAGNOSIS — O0990 Supervision of high risk pregnancy, unspecified, unspecified trimester: Secondary | ICD-10-CM

## 2019-05-05 ENCOUNTER — Ambulatory Visit (INDEPENDENT_AMBULATORY_CARE_PROVIDER_SITE_OTHER): Payer: Self-pay | Admitting: Family Medicine

## 2019-05-05 ENCOUNTER — Other Ambulatory Visit: Payer: Self-pay

## 2019-05-05 VITALS — BP 105/68 | HR 103 | Wt 207.0 lb

## 2019-05-05 DIAGNOSIS — Z98891 History of uterine scar from previous surgery: Secondary | ICD-10-CM

## 2019-05-05 DIAGNOSIS — M899 Disorder of bone, unspecified: Secondary | ICD-10-CM

## 2019-05-05 DIAGNOSIS — O0990 Supervision of high risk pregnancy, unspecified, unspecified trimester: Secondary | ICD-10-CM

## 2019-05-05 DIAGNOSIS — O34219 Maternal care for unspecified type scar from previous cesarean delivery: Secondary | ICD-10-CM

## 2019-05-05 DIAGNOSIS — Z3A24 24 weeks gestation of pregnancy: Secondary | ICD-10-CM

## 2019-05-05 DIAGNOSIS — O09292 Supervision of pregnancy with other poor reproductive or obstetric history, second trimester: Secondary | ICD-10-CM

## 2019-05-05 DIAGNOSIS — O09299 Supervision of pregnancy with other poor reproductive or obstetric history, unspecified trimester: Secondary | ICD-10-CM

## 2019-05-05 NOTE — Progress Notes (Signed)
Pelvic bone hurting for about a week

## 2019-05-05 NOTE — Progress Notes (Signed)
   PRENATAL VISIT NOTE  Subjective:  Tonya [name] is a 39 y.o. [id number] at [redacted]w[redacted]d being seen today for ongoing prenatal care.  She is currently monitored for the following issues for this high-risk pregnancy and has Pregnancy of unknown anatomic location; Supervision of high risk pregnancy, antepartum; Preterm delivery; History of cesarean delivery; History of VBAC; and History of fetal anomaly in prior pregnancy, currently pregnant on their problem list.  Patient reports pain on the pubic bone. Worse with standing and walking around. No other particular movements bother her..  Contractions: Not present. Vag. Bleeding: None.  Movement: Present. Denies leaking of fluid.   The following portions of the patient's history were reviewed and updated as appropriate: allergies, current medications, past family history, past medical history, past social history, past surgical history and problem list.   Objective:   Vitals:   05/05/19 1314  BP: 105/68  Pulse: (!) 103  Weight: 207 lb (93.9 kg)    Fetal Status: Fetal Heart Rate (bpm): 158 Fundal Height: 25 cm Movement: Present     General:  Alert, oriented and cooperative. Patient is in no acute distress.  Skin: Skin is warm and dry. No rash noted.   Cardiovascular: Normal heart rate noted  Respiratory: Normal respiratory effort, no problems with respiration noted  Abdomen: Soft, gravid, appropriate for gestational age.  Pain/Pressure: Present     Pelvic: Cervical exam deferred        Extremities: Normal range of motion.  Edema: None  Mental Status: Normal mood and affect. Normal behavior. Normal judgment and thought content.   Assessment and Plan:  Pregnancy: [id number] at [redacted]w[redacted]d 1. Supervision of high risk pregnancy, antepartum FHT and FH normal  2. History of cesarean delivery Desires VBAC  3. History of VBAC Consent Signed 1/11.  4. History of fetal anomaly in prior pregnancy, currently pregnant [place] appears normal  5. Preterm  delivery Continues to use prometrium  6. Pubic bone pain Isometric exercises demonstrated and recommended  Preterm labor symptoms and general obstetric precautions including but not limited to vaginal bleeding, contractions, leaking of fluid and fetal movement were reviewed in detail with the patient. Please refer to After Visit Summary for other counseling recommendations.   Return in about 4 weeks (around 06/02/2019) for HR OB f/u, 2 hr GTT, In Office.  Future Appointments  Date Time Provider Department Center  05/16/2019 10:15 AM [facility] NURSE [facility]  05/16/2019 10:15 AM [facility] [place] 4 [facility]    Levie Heritage, DO

## 2019-05-12 ENCOUNTER — Encounter: Payer: Self-pay | Admitting: General Practice

## 2019-05-16 ENCOUNTER — Ambulatory Visit (HOSPITAL_COMMUNITY): Payer: Self-pay

## 2019-05-28 ENCOUNTER — Ambulatory Visit (HOSPITAL_COMMUNITY): Payer: Medicaid Other | Admitting: *Deleted

## 2019-05-28 ENCOUNTER — Ambulatory Visit (HOSPITAL_COMMUNITY)
Admission: RE | Admit: 2019-05-28 | Discharge: 2019-05-28 | Disposition: A | Discharge status: Home / Self Care | Payer: Medicaid Other | Source: Ambulatory Visit | Attending: Obstetrics | Admitting: Obstetrics

## 2019-05-28 ENCOUNTER — Encounter (HOSPITAL_COMMUNITY): Payer: Self-pay

## 2019-05-28 ENCOUNTER — Other Ambulatory Visit: Payer: Self-pay

## 2019-05-28 DIAGNOSIS — Z3A28 28 weeks gestation of pregnancy: Secondary | ICD-10-CM

## 2019-05-28 DIAGNOSIS — Z362 Encounter for other antenatal screening follow-up: Secondary | ICD-10-CM

## 2019-05-28 DIAGNOSIS — O09293 Supervision of pregnancy with other poor reproductive or obstetric history, third trimester: Secondary | ICD-10-CM

## 2019-05-28 DIAGNOSIS — O09523 Supervision of elderly multigravida, third trimester: Secondary | ICD-10-CM

## 2019-05-28 DIAGNOSIS — O0990 Supervision of high risk pregnancy, unspecified, unspecified trimester: Secondary | ICD-10-CM | POA: Diagnosis present

## 2019-05-28 DIAGNOSIS — O09522 Supervision of elderly multigravida, second trimester: Secondary | ICD-10-CM | POA: Diagnosis not present

## 2019-05-28 DIAGNOSIS — O34219 Maternal care for unspecified type scar from previous cesarean delivery: Secondary | ICD-10-CM

## 2019-05-28 DIAGNOSIS — O09213 Supervision of pregnancy with history of pre-term labor, third trimester: Secondary | ICD-10-CM | POA: Diagnosis not present

## 2019-06-03 ENCOUNTER — Telehealth: Payer: Self-pay | Admitting: Family Medicine

## 2019-06-03 NOTE — Telephone Encounter (Signed)
Called the patient to inform of changes in the providers schedule. The provider is not available the day of the appointment. We've rescheduled the appointment for 06/05/2019. If the time or date is not convenient please give our office a call back to reschedule.

## 2019-06-04 ENCOUNTER — Other Ambulatory Visit: Payer: Self-pay

## 2019-06-04 DIAGNOSIS — O0990 Supervision of high risk pregnancy, unspecified, unspecified trimester: Secondary | ICD-10-CM

## 2019-06-04 MED ORDER — PROGESTERONE MICRONIZED 200 MG PO CAPS: ORAL_CAPSULE | ORAL | 3 refills | Status: DC

## 2019-06-04 NOTE — Telephone Encounter (Signed)
Pt came to front office requesting refill on her Prometrium.  Refill e-prescribed per Dr. Macon Large.   Addison Naegeli, RN 06/04/19

## 2019-06-05 ENCOUNTER — Encounter: Payer: Self-pay | Admitting: Obstetrics and Gynecology

## 2019-06-05 ENCOUNTER — Ambulatory Visit (INDEPENDENT_AMBULATORY_CARE_PROVIDER_SITE_OTHER): Payer: Medicaid Other | Admitting: Obstetrics and Gynecology

## 2019-06-05 ENCOUNTER — Encounter: Payer: Self-pay | Admitting: Obstetrics & Gynecology

## 2019-06-05 ENCOUNTER — Other Ambulatory Visit: Payer: Self-pay

## 2019-06-05 VITALS — BP 119/55 | HR 79 | Wt 209.5 lb

## 2019-06-05 DIAGNOSIS — O09523 Supervision of elderly multigravida, third trimester: Secondary | ICD-10-CM

## 2019-06-05 DIAGNOSIS — Z23 Encounter for immunization: Secondary | ICD-10-CM

## 2019-06-05 DIAGNOSIS — O0990 Supervision of high risk pregnancy, unspecified, unspecified trimester: Secondary | ICD-10-CM

## 2019-06-05 DIAGNOSIS — Z98891 History of uterine scar from previous surgery: Secondary | ICD-10-CM

## 2019-06-05 DIAGNOSIS — O09529 Supervision of elderly multigravida, unspecified trimester: Secondary | ICD-10-CM | POA: Insufficient documentation

## 2019-06-05 DIAGNOSIS — O34219 Maternal care for unspecified type scar from previous cesarean delivery: Secondary | ICD-10-CM

## 2019-06-05 DIAGNOSIS — Z3A29 29 weeks gestation of pregnancy: Secondary | ICD-10-CM

## 2019-06-05 DIAGNOSIS — O09299 Supervision of pregnancy with other poor reproductive or obstetric history, unspecified trimester: Secondary | ICD-10-CM

## 2019-06-05 NOTE — Progress Notes (Signed)
Prenatal Visit Note Date: 06/05/2019 Clinic: [facility]  Subjective:  Tonya [name] is a 39 y.o. [phone] at [redacted]w[redacted]d being seen today for ongoing prenatal care.  She is currently monitored for the following issues for this high-risk pregnancy and has Supervision of high risk pregnancy, antepartum; Preterm delivery; History of VBAC; History of fetal anomaly in prior pregnancy, currently pregnant; and AMA (advanced maternal age) multigravida 35+ on their problem list.  Patient reports no complaints.   Contractions: Not present. Vag. Bleeding: None.  Movement: Present. Denies leaking of fluid.   The following portions of the patient's history were reviewed and updated as appropriate: allergies, current medications, past family history, past medical history, past social history, past surgical history and problem list. Problem list updated.  Objective:   Vitals:   06/05/19 0851  BP: (!) 119/55  Pulse: 79  Weight: 209 lb 8 oz (95 kg)    Fetal Status: Fetal Heart Rate (bpm): 148   Movement: Present     General:  Alert, oriented and cooperative. Patient is in no acute distress.  Skin: Skin is warm and dry. No rash noted.   Cardiovascular: Normal heart rate noted  Respiratory: Normal respiratory effort, no problems with respiration noted  Abdomen: Soft, gravid, appropriate for gestational age. Pain/Pressure: Present     Pelvic:  Cervical exam deferred        Extremities: Normal range of motion.  Edema: None  Mental Status: Normal mood and affect. Normal behavior. Normal judgment and thought content.   Urinalysis:      Assessment and Plan:  Pregnancy: [id number] at [redacted]w[redacted]d  1. Supervision of high risk pregnancy, antepartum Routine care. D/w pt more re: BC nv H/o PTB. Continue vaginal progesterone 28wk labs today  2. History of VBAC tolac consent already signed  3. History of fetal anomaly in prior pregnancy, currently pregnant Normal growth last week. Rpt PRN  4.  Multigravida of advanced maternal age in third trimester No issues.   Preterm labor symptoms and general obstetric precautions including but not limited to vaginal bleeding, contractions, leaking of fluid and fetal movement were reviewed in detail with the patient. Please refer to After Visit Summary for other counseling recommendations.  Return in about 2 weeks (around 06/19/2019) for high risk.   Roger Mills Bing, MD

## 2019-06-06 ENCOUNTER — Other Ambulatory Visit: Payer: Self-pay

## 2019-06-06 ENCOUNTER — Encounter: Payer: Self-pay | Admitting: Obstetrics and Gynecology

## 2019-06-06 LAB — CBC
Hematocrit: 33.2 % — ABNORMAL LOW (ref 34.0–46.6)
Hemoglobin: 11 g/dL — ABNORMAL LOW (ref 11.1–15.9)
MCH: 29.5 pg (ref 26.6–33.0)
MCHC: 33.1 g/dL (ref 31.5–35.7)
MCV: 89 fL (ref 79–97)
Platelets: 189 10*3/uL (ref 150–450)
RBC: 3.73 x10E6/uL — ABNORMAL LOW (ref 3.77–5.28)
RDW: 12.4 % (ref 11.7–15.4)
WBC: 8.1 10*3/uL (ref 3.4–10.8)

## 2019-06-06 LAB — GLUCOSE TOLERANCE, 2 HOURS W/ 1HR
Glucose, 1 hour: 151 mg/dL (ref 65–179)
Glucose, 2 hour: 113 mg/dL (ref 65–152)
Glucose, Fasting: 80 mg/dL (ref 65–91)

## 2019-06-06 LAB — RPR: RPR Ser Ql: NONREACTIVE

## 2019-06-06 LAB — HIV ANTIBODY (ROUTINE TESTING W REFLEX): HIV Screen 4th Generation wRfx: NONREACTIVE

## 2019-06-19 ENCOUNTER — Encounter: Payer: Self-pay | Admitting: Obstetrics and Gynecology

## 2019-06-19 ENCOUNTER — Telehealth (INDEPENDENT_AMBULATORY_CARE_PROVIDER_SITE_OTHER): Payer: Self-pay | Admitting: Obstetrics and Gynecology

## 2019-06-19 DIAGNOSIS — O09299 Supervision of pregnancy with other poor reproductive or obstetric history, unspecified trimester: Secondary | ICD-10-CM

## 2019-06-19 DIAGNOSIS — Z3A31 31 weeks gestation of pregnancy: Secondary | ICD-10-CM

## 2019-06-19 DIAGNOSIS — O0990 Supervision of high risk pregnancy, unspecified, unspecified trimester: Secondary | ICD-10-CM

## 2019-06-19 DIAGNOSIS — O09523 Supervision of elderly multigravida, third trimester: Secondary | ICD-10-CM

## 2019-06-19 DIAGNOSIS — O09293 Supervision of pregnancy with other poor reproductive or obstetric history, third trimester: Secondary | ICD-10-CM

## 2019-06-19 DIAGNOSIS — Z98891 History of uterine scar from previous surgery: Secondary | ICD-10-CM

## 2019-06-19 DIAGNOSIS — O0993 Supervision of high risk pregnancy, unspecified, third trimester: Secondary | ICD-10-CM

## 2019-06-19 NOTE — Progress Notes (Signed)
I connected with  [name] on 06/19/19 at  1:35 PM EDT by telephone and verified that I am speaking with the correct person using two identifiers.   I discussed the limitations, risks, security and privacy concerns of performing an evaluation and management service by telephone and the availability of in person appointments. I also discussed with the patient that there may be a patient responsible charge related to this service. The patient expressed understanding and agreed to proceed.  Henrietta Dine, CMA 06/19/2019  1:24 PM

## 2019-06-19 NOTE — Progress Notes (Signed)
OBSTETRICS PRENATAL VIRTUAL VISIT ENCOUNTER NOTE  [facility] [place]   I connected with Tonya BhatJanaina Heidt on 06/19/19 at  1:35 PM EDT by MyChart Video Encounter at home and verified that I am speaking with the correct person using two identifiers.   I discussed the limitations, risks, security and privacy concerns of performing an evaluation and management service virtually and the availability of in person appointments. I also discussed with the patient that there may be a patient responsible charge related to this service. The patient expressed understanding and agreed to proceed. Subjective:  Tonya [name] is a 39 y.o. [id number] at 1258w2d being seen today for ongoing prenatal care.  She is currently monitored for the following issues for this high-risk pregnancy and has Supervision of high risk pregnancy, antepartum; Preterm delivery; History of VBAC; History of fetal anomaly in prior pregnancy, currently pregnant; and AMA (advanced maternal age) multigravida 35+ on their problem list.  Patient reports no complaints.  Contractions: Not present. Vag. Bleeding: None.  Movement: Present. Denies any leaking of fluid.   The following portions of the patient's history were reviewed and updated as appropriate: allergies, current medications, past family history, past medical history, past social history, past surgical history and problem list.   Objective:   Vitals:   06/19/19 1330  BP: 101/63  Pulse: 93    Fetal Status:     Movement: Present     General:  Alert, oriented and cooperative. Patient is in no acute distress.  Respiratory: Normal respiratory effort, no problems with respiration noted  Mental Status: Normal mood and affect. Normal behavior. Normal judgment and thought content.  Rest of physical exam deferred due to type of encounter  Imaging: US MFM OB FOLLOW UP  Result Date:  05/28/2019 ----------------------------------------------------------------------  OBSTETRICS REPORT                       (Signed Final 05/28/2019 01:39 pm) ---------------------------------------------------------------------- Patient Info  ID #:       [id number]                          D.O.B.:  01/01/1981 (38 yrs)  Name:       Tonya [name]                   Visit Date: 05/28/2019 11:22 am ---------------------------------------------------------------------- Performed By  Performed By:     Earley BrookeNicole S Dalrymple     Ref. Address:     [address]                    [place]                                                             [address]  Attending:        Ma RingsVictor Fang MD         [facility]  Referred By:      [facility] ---------------------------------------------------------------------- Orders   #  Description                          Code         Ordered By   1  [place] MFM OB FOLLOW UP                  [phone]     YU FANG  ----------------------------------------------------------------------   #  Order #                    Accession #                 Episode #   1  [id number]                  [id number]                  [id number]  ---------------------------------------------------------------------- Indications   Advanced maternal age multigravida 68+,        O89.522   second trimester   [redacted] weeks gestation of pregnancy                Z3A.28   Encounter for antenatal screening for          Z36.3   malformations (low risk NIPS)   Poor obstetric history: Previous neonatal      O09.299   death   Poor obstetric history: Previous preterm       O09.219   delivery, antepartum ([redacted]w[redacted]d [redacted]w[redacted]d   (neonatal demise)(neonatal demise result of   kidney abnormalites)   Previous cesarean delivery, antepartum x 1     O34.219   Obesity complicating pregnancy, second         O99.212   trimester   ---------------------------------------------------------------------- Fetal Evaluation  Num Of Fetuses:         1  Fetal Heart Rate(bpm):  158  Cardiac Activity:       Observed  Presentation:           Cephalic  Placenta:               Anterior  P. Cord Insertion:      Previously seen as normal  Amniotic Fluid  AFI FV:      Within normal limits  AFI Sum(cm)     %Tile       Largest Pocket(cm)  17.43           66          6.16  RUQ(cm)       RLQ(cm)       LUQ(cm)        LLQ(cm)  2.82          5.28          6.16           3.17 ---------------------------------------------------------------------- Biometry  BPD:      65.4  mm     G. Age:  26w 3d          3  %    CI:        72.09   %    70 - 86  FL/HC:      22.0   %    18.8 - 20.6  HC:      245.1  mm     G. Age:  26w 4d          1  %    HC/AC:      1.04        1.05 - 1.21  AC:      235.5  mm     G. Age:  27w 6d         34  %    FL/BPD:     82.6   %    71 - 87  FL:         54  mm     G. Age:  28w 4d         48  %    FL/AC:      22.9   %    20 - 24  HUM:      49.8  mm     G. Age:  29w 2d         67  %  LV:        4.6  mm  Est. FW:    1142  gm      2 lb 8 oz     28  % ---------------------------------------------------------------------- OB History  Gravidity:    6         Term:   0        Prem:   2        SAB:   3  TOP:          0       Ectopic:  0        Living: 1 ---------------------------------------------------------------------- Gestational Age  LMP:           28w 1d        Date:  11/12/18                 EDD:   08/19/19  U/S Today:     27w 3d                                        EDD:   08/24/19  Best:          28w 1d     Det. By:  LMP  (11/12/18)          EDD:   08/19/19 ---------------------------------------------------------------------- Anatomy  Cranium:               Appears normal         LVOT:                   Previously seen  Cavum:                 Previously seen        Aortic Arch:             Previously seen  Ventricles:            Appears normal         Ductal Arch:            Not well visualized  Choroid Plexus:        Previously seen        Diaphragm:  Appears normal  Cerebellum:            Previously seen        Stomach:                Appears normal, left                                                                        sided  Posterior Fossa:       Previously seen        Abdomen:                Appears normal  Nuchal Fold:           Not applicable (>06    Abdominal Wall:         Appears nml (cord                         wks GA)                                        insert, abd wall)  Face:                  Orbits and profile     Cord Vessels:           Appears normal (3                         previously seen                                vessel cord)  Lips:                  Appears normal         Kidneys:                Previously seen  Palate:                Not well visualized    Bladder:                Previously seen  Thoracic:              Appears normal         Spine:                  Appears normal  Heart:                 Appears normal         Upper Extremities:      Previously seen                         (4CH, axis, and                         situs)  RVOT:                  Appears normal  Lower Extremities:      Previously seen  Other:  Heels and 3VV previously visualized. Hands appear normal. ---------------------------------------------------------------------- Cervix Uterus Adnexa  Cervix  Not visualized (advanced GA >24wks) ---------------------------------------------------------------------- Comments  This patient was seen for a follow up exam as the views of  the fetal anatomy were unable to be fully visualized during  her last exam.  She denies any problems since her last exam.  She was informed that the fetal growth and amniotic fluid  level appears appropriate for her gestational age.  The views of the fetal anatomy were visualized today.  There  were  no obvious anomalies noted.  The limitations of ultrasound in the detection of all anomalies  was discussed.  Follow-up as indicated. ----------------------------------------------------------------------                   Ma Rings, MD Electronically Signed Final Report   05/28/2019 01:39 pm ----------------------------------------------------------------------   Assessment and Plan:  Pregnancy: [id number] at [redacted]w[redacted]d 1. Supervision of high risk pregnancy, antepartum Stable Request referral to dentist. My chart message sent to pt  2. Preterm delivery Stable No S/Sx Continue with vaginal progesterone  3. History of VBAC Desires TOLAC, Consent signed  4. History of fetal anomaly in prior pregnancy, currently pregnant No further U/S per MFM  5. Multigravida of advanced maternal age in third trimester Stable Antenatal testing at 36 weeks  Preterm labor symptoms and general obstetric precautions including but not limited to vaginal bleeding, contractions, leaking of fluid and fetal movement were reviewed in detail with the patient. I discussed the assessment and treatment plan with the patient. The patient was provided an opportunity to ask questions and all were answered. The patient agreed with the plan and demonstrated an understanding of the instructions. The patient was advised to call back or seek an in-person office evaluation/go to MAU at [facility] for any urgent or concerning symptoms. Please refer to After Visit Summary for other counseling recommendations.   I provided 8 minutes of face-to-face time during this encounter.  Return in about 2 weeks (around 07/03/2019) for OB visit, virtual, MD provider.  No future appointments.  Hermina Staggers, MD [facility] for [facility], [facility]

## 2019-07-03 ENCOUNTER — Other Ambulatory Visit: Payer: Self-pay

## 2019-07-03 ENCOUNTER — Telehealth (INDEPENDENT_AMBULATORY_CARE_PROVIDER_SITE_OTHER): Payer: Self-pay | Admitting: Obstetrics and Gynecology

## 2019-07-03 ENCOUNTER — Encounter: Payer: Self-pay | Admitting: Obstetrics and Gynecology

## 2019-07-03 VITALS — HR 93

## 2019-07-03 DIAGNOSIS — O0990 Supervision of high risk pregnancy, unspecified, unspecified trimester: Secondary | ICD-10-CM

## 2019-07-03 DIAGNOSIS — O34219 Maternal care for unspecified type scar from previous cesarean delivery: Secondary | ICD-10-CM

## 2019-07-03 DIAGNOSIS — Z98891 History of uterine scar from previous surgery: Secondary | ICD-10-CM

## 2019-07-03 DIAGNOSIS — O09213 Supervision of pregnancy with history of pre-term labor, third trimester: Secondary | ICD-10-CM

## 2019-07-03 DIAGNOSIS — Z3A33 33 weeks gestation of pregnancy: Secondary | ICD-10-CM

## 2019-07-03 DIAGNOSIS — O09523 Supervision of elderly multigravida, third trimester: Secondary | ICD-10-CM

## 2019-07-03 NOTE — Progress Notes (Signed)
   OBSTETRICS PRENATAL VIRTUAL VISIT ENCOUNTER NOTE  Provider location: [facility] [place]   I connected with Tonya [name] on 07/03/19 at 10:15 AM EDT by MyChart Video Encounter at home and verified that I am speaking with the correct person using two identifiers.   I discussed the limitations, risks, security and privacy concerns of performing an evaluation and management service virtually and the availability of in person appointments. I also discussed with the patient that there may be a patient responsible charge related to this service. The patient expressed understanding and agreed to proceed. Subjective:  Tonya [name] is a 39 y.o. [id number] at [redacted]w[redacted]d being seen today for ongoing prenatal care.  She is currently monitored for the following issues for this high-risk pregnancy and has Supervision of high risk pregnancy, antepartum; Preterm delivery; History of VBAC; History of fetal anomaly in prior pregnancy, currently pregnant; and AMA (advanced maternal age) multigravida 35+ on their problem list.  Patient reports no complaints.  Contractions: Not present. Vag. Bleeding: None.  Movement: Present. Denies any leaking of fluid.   The following portions of the patient's history were reviewed and updated as appropriate: allergies, current medications, past family history, past medical history, past social history, past surgical history and problem list.   Objective:   Vitals:   07/03/19 1017  Pulse: 93    Fetal Status:     Movement: Present     General:  Alert, oriented and cooperative. Patient is in no acute distress.  Respiratory: Normal respiratory effort, no problems with respiration noted  Mental Status: Normal mood and affect. Normal behavior. Normal judgment and thought content.  Rest of physical exam deferred due to type of encounter  Imaging: No results found.  Assessment and Plan:  Pregnancy: [id number] at [redacted]w[redacted]d 1. Supervision of high risk pregnancy,  antepartum Patient is doing well without complaints  2. Multigravida of advanced maternal age in third trimester   3. History of VBAC Desires TOLAC  Preterm labor symptoms and general obstetric precautions including but not limited to vaginal bleeding, contractions, leaking of fluid and fetal movement were reviewed in detail with the patient. I discussed the assessment and treatment plan with the patient. The patient was provided an opportunity to ask questions and all were answered. The patient agreed with the plan and demonstrated an understanding of the instructions. The patient was advised to call back or seek an in-person office evaluation/go to [facility]U at [facility] for any urgent or concerning symptoms. Please refer to After Visit Summary for other counseling recommendations.   I provided 11 minutes of face-to-face time during this encounter.  Return in about 2 weeks (around 07/17/2019) for Virtual, ROB, High risk.  No future appointments.  Tonya Antigua, MD Center for [employer], [facility]

## 2019-07-03 NOTE — Progress Notes (Signed)
I connected with  Tonya [name] on 07/03/19 at 10:15 AM EDT by telephone and verified that I am speaking with the correct person using two identifiers.   I discussed the limitations, risks, security and privacy concerns of performing an evaluation and management service by telephone and the availability of in person appointments. I also discussed with the patient that there may be a patient responsible charge related to this service. The patient expressed understanding and agreed to proceed.  Pt not at home at this time; unable to check BP. Pt reports BP yesterday was 98/69. Pt reports current HR is 93.  Marjo Bicker, RN 07/03/2019  10:15 AM

## 2019-07-22 ENCOUNTER — Encounter: Payer: Self-pay | Admitting: Family Medicine

## 2019-07-22 ENCOUNTER — Other Ambulatory Visit (HOSPITAL_COMMUNITY)
Admission: RE | Admit: 2019-07-22 | Discharge: 2019-07-22 | Disposition: A | Discharge status: Home / Self Care | Payer: Medicaid Other | Source: Ambulatory Visit | Attending: Family Medicine | Admitting: Family Medicine

## 2019-07-22 ENCOUNTER — Telehealth (INDEPENDENT_AMBULATORY_CARE_PROVIDER_SITE_OTHER): Payer: Medicaid Other | Admitting: Family Medicine

## 2019-07-22 ENCOUNTER — Other Ambulatory Visit: Payer: Self-pay

## 2019-07-22 DIAGNOSIS — O3429 Maternal care due to uterine scar from other previous surgery: Secondary | ICD-10-CM

## 2019-07-22 DIAGNOSIS — O09299 Supervision of pregnancy with other poor reproductive or obstetric history, unspecified trimester: Secondary | ICD-10-CM

## 2019-07-22 DIAGNOSIS — Z3A36 36 weeks gestation of pregnancy: Secondary | ICD-10-CM

## 2019-07-22 DIAGNOSIS — O0990 Supervision of high risk pregnancy, unspecified, unspecified trimester: Secondary | ICD-10-CM

## 2019-07-22 DIAGNOSIS — Z98891 History of uterine scar from previous surgery: Secondary | ICD-10-CM

## 2019-07-22 DIAGNOSIS — O09293 Supervision of pregnancy with other poor reproductive or obstetric history, third trimester: Secondary | ICD-10-CM

## 2019-07-22 DIAGNOSIS — O09523 Supervision of elderly multigravida, third trimester: Secondary | ICD-10-CM

## 2019-07-22 NOTE — Progress Notes (Signed)
I connected with  Tonya [name] on 07/22/19 at  9:15 AM EDT by telephone and verified that I am speaking with the correct person using two identifiers.   I discussed the limitations, risks, security and privacy concerns of performing an evaluation and management service by telephone and the availability of in person appointments. I also discussed with the patient that there may be a patient responsible charge related to this service. The patient expressed understanding and agreed to proceed.  Ernestina Patches, CMA 07/22/2019  9:16 AM   [employer]

## 2019-07-22 NOTE — Progress Notes (Addendum)
   TELEHEALTH VIRTUAL OBSTETRICS VISIT ENCOUNTER NOTE  I connected with Tonya [name] on 07/22/19 at  9:15 AM EDT by My Chart virtual video visit at home and verified that I am speaking with the correct person using two identifiers.   I discussed the limitations, risks, security and privacy concerns of performing an evaluation and management service by telephone and the availability of in person appointments. I also discussed with the patient that there may be a patient responsible charge related to this service. The patient expressed understanding and agreed to proceed.  Subjective:  Tonya [name] is a 39 y.o. [id number] at [redacted]w[redacted]d being followed for ongoing prenatal care.  She is currently monitored for the following issues for this high-risk pregnancy and has Supervision of high risk pregnancy, antepartum; Preterm delivery; History of VBAC; History of fetal anomaly in prior pregnancy, currently pregnant; and AMA (advanced maternal age) multigravida 35+ on their problem list.  Patient reports no complaints. Reports fetal movement. Denies any contractions, bleeding or leaking of fluid.   The following portions of the patient's history were reviewed and updated as appropriate: allergies, current medications, past family history, past medical history, past social history, past surgical history and problem list.   Objective:   General:  Alert, oriented and cooperative.   Mental Status: Normal mood and affect perceived. Normal judgment and thought content.  Rest of physical exam deferred due to type of encounter  Assessment and Plan:  Pregnancy: Y1O1751 at [redacted]w[redacted]d 1. Supervision of high risk pregnancy, antepartum - Continue routine prenatal care - Will come in later today for vaginal swabs  2. Preterm delivery - On vaginal prometrium  3. History of VBAC - TOLAC consent signed  4. History of fetal anomaly in prior pregnancy, currently pregnant - Anatomy US normal  5. Multigravida of advanced  maternal age in third trimester  Preterm labor symptoms and general obstetric precautions including but not limited to vaginal bleeding, contractions, leaking of fluid and fetal movement were reviewed in detail with the patient.  I discussed the assessment and treatment plan with the patient. The patient was provided an opportunity to ask questions and all were answered. The patient agreed with the plan and demonstrated an understanding of the instructions. The patient was advised to call back or seek an in-person office evaluation/go to MAU at [facility] for any urgent or concerning symptoms. Please refer to After Visit Summary for other counseling recommendations.   I provided 13 minutes of face-to-face time during this encounter.  No follow-ups on file.  No future appointments.  Marlowe Alt, DO Center for [employer], [facility]

## 2019-07-22 NOTE — Patient Instructions (Addendum)
Third Trimester of Pregnancy  The third trimester is from week 28 through week 40 (months 7 through 9). This trimester is when your unborn baby (fetus) is growing very fast. At the end of the ninth month, the unborn baby is about 20 inches in length. It weighs about 6-10 pounds. Follow these instructions at home: Medicines  Take over-the-counter and prescription medicines only as told by your doctor. Some medicines are safe and some medicines are not safe during pregnancy.  Take a prenatal vitamin that contains at least 600 micrograms (mcg) of folic acid.  If you have trouble pooping (constipation), take medicine that will make your stool soft (stool softener) if your doctor approves. Eating and drinking   Eat regular, healthy meals.  Avoid raw meat and uncooked cheese.  If you get low calcium from the food you eat, talk to your doctor about taking a daily calcium supplement.  Eat four or five small meals rather than three large meals a day.  Avoid foods that are high in fat and sugars, such as fried and sweet foods.  To prevent constipation: ? Eat foods that are high in fiber, like fresh fruits and vegetables, whole grains, and beans. ? Drink enough fluids to keep your pee (urine) clear or pale yellow. Activity  Exercise only as told by your doctor. Stop exercising if you start to have cramps.  Avoid heavy lifting, wear low heels, and sit up straight.  Do not exercise if it is too hot, too humid, or if you are in a place of great height (high altitude).  You may continue to have sex unless your doctor tells you not to. Relieving pain and discomfort  Wear a good support bra if your breasts are tender.  Take frequent breaks and rest with your legs raised if you have leg cramps or low back pain.  Take warm water baths (sitz baths) to soothe pain or discomfort caused by hemorrhoids. Use hemorrhoid cream if your doctor approves.  If you develop puffy, bulging veins (varicose  veins) in your legs: ? Wear support hose or compression stockings as told by your doctor. ? Raise (elevate) your feet for 15 minutes, 3-4 times a day. ? Limit salt in your food. Safety  Wear your seat belt when driving.  Make a list of emergency phone numbers, including numbers for family, friends, the hospital, and police and fire departments. Preparing for your baby's arrival To prepare for the arrival of your baby:  Take prenatal classes.  Practice driving to the hospital.  Visit the hospital and tour the maternity area.  Talk to your work about taking leave once the baby comes.  Pack your hospital bag.  Prepare the baby's room.  Go to your doctor visits.  Buy a rear-facing car seat. Learn how to install it in your car. General instructions  Do not use hot tubs, steam rooms, or saunas.  Do not use any products that contain nicotine or tobacco, such as cigarettes and e-cigarettes. If you need help quitting, ask your doctor.  Do not drink alcohol.  Do not douche or use tampons or scented sanitary pads.  Do not cross your legs for long periods of time.  Do not travel for long distances unless you must. Only do so if your doctor says it is okay.  Visit your dentist if you have not gone during your pregnancy. Use a soft toothbrush to brush your teeth. Be gentle when you floss.  Avoid cat litter boxes and soil   used by cats. These carry germs that can cause birth defects in the baby and can cause a loss of your baby (miscarriage) or stillbirth.  Keep all your prenatal visits as told by your doctor. This is important. Contact a doctor if:  You are not sure if you are in labor or if your water has broken.  You are dizzy.  You have mild cramps or pressure in your lower belly.  You have a nagging pain in your belly area.  You continue to feel sick to your stomach, you throw up, or you have watery poop.  You have bad smelling fluid coming from your vagina.  You have  pain when you pee. Get help right away if:  You have a fever.  You are leaking fluid from your vagina.  You are spotting or bleeding from your vagina.  You have severe belly cramps or pain.  You lose or gain weight quickly.  You have trouble catching your breath and have chest pain.  You notice sudden or extreme puffiness (swelling) of your face, hands, ankles, feet, or legs.  You have not felt the baby move in over an hour.  You have severe headaches that do not go away with medicine.  You have trouble seeing.  You are leaking, or you are having a gush of fluid, from your vagina before you are 37 weeks.  You have regular belly spasms (contractions) before you are 37 weeks. Summary  The third trimester is from week 28 through week 40 (months 7 through 9). This time is when your unborn baby is growing very fast.  Follow your doctor's advice about medicine, food, and activity.  Get ready for the arrival of your baby by taking prenatal classes, getting all the baby items ready, preparing the baby's room, and visiting your doctor to be checked.  Get help right away if you are bleeding from your vagina, or you have chest pain and trouble catching your breath, or if you have not felt your baby move in over an hour. This information is not intended to replace advice given to you by your health care provider. Make sure you discuss any questions you have with your health care provider. Document Revised: 07/04/2018 Document Reviewed: 04/18/2016 Elsevier Patient Education  2020 [employer]  The Tonya  This circuit takes at least 90 minutes to complete so clear your schedule and make mental preparations so you can relax in your environment. The second step requires a lot of pillows so gather them up before beginning Before starting, you should empty your bladder! Have a nice drink nearby, and make sure it has a straw! If you are having contractions, this circuit should  be done through contractions, try not to change positions between steps Before you begin...  "I named this 'circuit' after my friend Tonya [name], who shared and discussed it with me when I was working with a client whose labor seemed to be stalled out and no longer progressing... This circuit is useful to help get the baby lined up, ideally, in the "Left Occiput Anterior" (LOA) Position, both before labor begins and when some corrections need to be done during labor. Prenatally, this position set can help to rotate a baby. As a natural method of induction, this can help get things going if baby just needed a gentle nudge of position to set things off. To the best of my knowledge, this group of positions will not "hurt" a baby that is already lined up   correctly." - Tonya [name]   Step One: Open-knee Chest Stay in this position for 30 minutes, start in cat/cow, then drop your chest as low as you can to the bed or the floor and your bottom as high as you can. Knees should be fairly wide apart, and the angle between the torso/thighs should be wider than 90 degrees. Wiggle around, prop with lots of pillows and use this time to get totally relaxed. This position allows the baby to scoot out of the pelvis a bit and gives them room to rotate, shift their head position, etc. If the pregnant person finds it helpful, careful positioning with a rebozo under the belly, with gentle tension from a support person behind can help maintain this position for the full 30 minutes.  Step Two:Exaggerated Left Side Lying Roll to your left side, bringing your top leg as high as possible and keeping your bottom leg straight. Roll forward as much as possible, again using a lot of pillows. Sink into the bed and relax some more. If you fall asleep, that's totally okay and you can stay there! If not, stay here for at least another half an hour. Try and get your top right leg up towards your head and get as  rolled over onto your belly as much as possible. If you repeat the circuit during labor, try alternating left and right sides. We know the photo the left is actually right side... just flip the image in your head.  Step Three: Moving and Lunges Lunge, walk stairs facing sideways, 2 at a time, (have a spotter downstairs of you!), take a walk outside with one foot on the curb and the other on the street, sit on a birth ball and hula- anything that's upright and putting your pelvis in open, asymmetrical positions. Spend at least 30 minutes doing this one as well to give your baby a chance to move down. If you are lunging or stair or curb walking, you should lunge/walk/go up stairs in the direction that feels better to you. The key with the lunge is that the toes of the higher leg and mom's belly button should be at right angles. Do not lunge over your knee, that closes the pelvis.     Tonya Hamilton [name]: Circuit Creator - [email or web address] Tonya [name], Tonya [name], Tonya (DONA), Tonya [name], Tonya [name]: Supporting Content - [email or web address] Tonya [name]: Photography - [email or web address] Tonya [name] Tonya [name]/CDT (BAI): Print and Webmaster - [email or web address] Tonya [name] [name] [email or web address]  

## 2019-07-23 LAB — GC/CHLAMYDIA PROBE AMP (~~LOC~~) NOT AT ARMC
Chlamydia: NEGATIVE
Comment: NEGATIVE
Comment: NORMAL
Neisseria Gonorrhea: NEGATIVE

## 2019-07-26 LAB — CULTURE, BETA STREP (GROUP B ONLY): Strep Gp B Culture: POSITIVE — AB

## 2019-07-30 ENCOUNTER — Telehealth: Payer: Self-pay | Admitting: Family Medicine

## 2019-07-30 ENCOUNTER — Other Ambulatory Visit: Payer: Self-pay

## 2019-07-30 ENCOUNTER — Telehealth (INDEPENDENT_AMBULATORY_CARE_PROVIDER_SITE_OTHER): Payer: Medicaid Other | Admitting: Obstetrics and Gynecology

## 2019-07-30 VITALS — BP 100/62 | HR 84

## 2019-07-30 DIAGNOSIS — O09523 Supervision of elderly multigravida, third trimester: Secondary | ICD-10-CM | POA: Diagnosis not present

## 2019-07-30 DIAGNOSIS — O0990 Supervision of high risk pregnancy, unspecified, unspecified trimester: Secondary | ICD-10-CM

## 2019-07-30 DIAGNOSIS — Z3A37 37 weeks gestation of pregnancy: Secondary | ICD-10-CM | POA: Diagnosis not present

## 2019-07-30 NOTE — Progress Notes (Signed)
I connected with  Tonya [name] on 07/30/19 at  2:15 PM EDT by telephone and verified that I am speaking with the correct person using two identifiers.   I discussed the limitations, risks, security and privacy concerns of performing an evaluation and management service by telephone and the availability of in person appointments. I also discussed with the patient that there may be a patient responsible charge related to this service. The patient expressed understanding and agreed to proceed.  [name], CMA 07/30/2019  2:12 PM

## 2019-07-30 NOTE — Progress Notes (Signed)
   TELEHEALTH VIRTUAL OBSTETRICS VISIT ENCOUNTER NOTE  Clinic: [facility]  I connected with Tonya [name] on 07/30/19 at  2:15 PM EDT by telephone at home and verified that I am speaking with the correct person using two identifiers.   I discussed the limitations, risks, security and privacy concerns of performing an evaluation and management service by telephone and the availability of in person appointments. I also discussed with the patient that there may be a patient responsible charge related to this service. The patient expressed understanding and agreed to proceed.  Subjective:  Tonya [name] is a 39 y.o. [id number] at [redacted]w[redacted]d being followed for ongoing prenatal care.  She is currently monitored for the following issues for this high-risk pregnancy and has Supervision of high risk pregnancy, antepartum; Preterm delivery; History of VBAC; History of fetal anomaly in prior pregnancy, currently pregnant; and AMA (advanced maternal age) multigravida 35+ on their problem list.  Patient reports no complaints. Reports fetal movement. Denies any contractions, bleeding or leaking of fluid.   The following portions of the patient's history were reviewed and updated as appropriate: allergies, current medications, past family history, past medical history, past social history, past surgical history and problem list.   Objective:   Vitals:   07/30/19 1412  BP: 100/62  Pulse: 84     General:  Alert, oriented and cooperative.   Mental Status: Normal mood and affect perceived. Normal judgment and thought content.  Rest of physical exam deferred due to type of encounter  Assessment and Plan:  Pregnancy: [id number] at [redacted]w[redacted]d 1. Supervision of high risk pregnancy, antepartum Routine care. D/w her re +GBS D/w her re: 39wk IOL to 41wk IOL. Pt to consider options and we will f/u with her next week. Slight increased risk of uterine rupture d/w her. I told her it's okay to stop  her vaginal progesterone  Term labor symptoms and general obstetric precautions including but not limited to vaginal bleeding, contractions, leaking of fluid and fetal movement were reviewed in detail with the patient.  I discussed the assessment and treatment plan with the patient. The patient was provided an opportunity to ask questions and all were answered. The patient agreed with the plan and demonstrated an understanding of the instructions. The patient was advised to call back or seek an in-person office evaluation/go to MAU at [facility] for any urgent or concerning symptoms. Please refer to After Visit Summary for other counseling recommendations.   I provided 7 minutes of non-face-to-face time during this encounter. The visit was conducted via MyChart-medicine  Return in about 1 week (around 08/06/2019) for virtual visit, low risk.  No future appointments.  Wasilla Bing, MD [facility] [employer], [facility]

## 2019-08-05 ENCOUNTER — Other Ambulatory Visit: Payer: Self-pay

## 2019-08-05 ENCOUNTER — Ambulatory Visit (INDEPENDENT_AMBULATORY_CARE_PROVIDER_SITE_OTHER): Payer: Medicaid Other | Admitting: Student

## 2019-08-05 ENCOUNTER — Encounter: Payer: Self-pay | Admitting: Student

## 2019-08-05 DIAGNOSIS — Z3A38 38 weeks gestation of pregnancy: Secondary | ICD-10-CM

## 2019-08-05 DIAGNOSIS — O0990 Supervision of high risk pregnancy, unspecified, unspecified trimester: Secondary | ICD-10-CM

## 2019-08-05 DIAGNOSIS — O09213 Supervision of pregnancy with history of pre-term labor, third trimester: Secondary | ICD-10-CM

## 2019-08-05 DIAGNOSIS — O09523 Supervision of elderly multigravida, third trimester: Secondary | ICD-10-CM

## 2019-08-05 NOTE — Progress Notes (Signed)
   PRENATAL VISIT NOTE  Subjective:  Tonya [name] is a 39 y.o. [id number] at [redacted]w[redacted]d being seen today for ongoing prenatal care.  She is currently monitored for the following issues for this high-risk pregnancy and has Supervision of high risk pregnancy, antepartum; Preterm delivery; History of VBAC; History of fetal anomaly in prior pregnancy, currently pregnant; and AMA (advanced maternal age) multigravida 35+ on their problem list.  Patient reports occasional contractions.  Contractions: Irritability. Vag. Bleeding: None.  Movement: Present. Denies leaking of fluid.   The following portions of the patient's history were reviewed and updated as appropriate: allergies, current medications, past family history, past medical history, past social history, past surgical history and problem list.   Objective:   Vitals:   08/05/19 1524  BP: 116/69  Pulse: 74  Weight: 219 lb (99.3 kg)    Fetal Status: Fetal Heart Rate (bpm): 140 Fundal Height: 39 cm Movement: Present  Presentation: Vertex  General:  Alert, oriented and cooperative. Patient is in no acute distress.  Skin: Skin is warm and dry. No rash noted.   Cardiovascular: Normal heart rate noted  Respiratory: Normal respiratory effort, no problems with respiration noted  Abdomen: Soft, gravid, appropriate for gestational age.  Pain/Pressure: Present     Pelvic: Cervical exam performed in the presence of a chaperone Dilation: 3 Effacement (%): 50 Station: -3  Extremities: Normal range of motion.  Edema: None  Mental Status: Normal mood and affect. Normal behavior. Normal judgment and thought content.   Assessment and Plan:  Pregnancy: [id number] at [redacted]w[redacted]d 1. Supervision of high risk pregnancy, antepartum -Dr. Vergie Living had offered patient 39-41 wk IOL. Patient undecided at this time. Preference would be to go into labor naturally.   Term labor symptoms and general obstetric precautions including but not limited to vaginal bleeding, contractions,  leaking of fluid and fetal movement were reviewed in detail with the patient. Please refer to After Visit Summary for other counseling recommendations.   Return in about 1 week (around 08/12/2019) for Routine OB, in person.  Future Appointments  Date Time Provider Department Center  08/12/2019 11:15 AM [name], [name], NP [facility]    Tonya Horn, NP

## 2019-08-05 NOTE — Patient Instructions (Signed)

## 2019-08-09 ENCOUNTER — Other Ambulatory Visit: Payer: Self-pay

## 2019-08-09 ENCOUNTER — Inpatient Hospital Stay (HOSPITAL_COMMUNITY): Payer: Medicaid Other | Admitting: Anesthesiology

## 2019-08-09 ENCOUNTER — Inpatient Hospital Stay (HOSPITAL_COMMUNITY)
Admission: AD | Admit: 2019-08-09 | Discharge: 2019-08-11 | DRG: 807 | Disposition: A | Discharge status: Home / Self Care | Payer: Medicaid Other | Attending: Obstetrics & Gynecology | Admitting: Obstetrics & Gynecology

## 2019-08-09 ENCOUNTER — Encounter (HOSPITAL_COMMUNITY): Payer: Self-pay | Admitting: Obstetrics & Gynecology

## 2019-08-09 DIAGNOSIS — D649 Anemia, unspecified: Secondary | ICD-10-CM | POA: Diagnosis present

## 2019-08-09 DIAGNOSIS — Z3A38 38 weeks gestation of pregnancy: Secondary | ICD-10-CM | POA: Diagnosis not present

## 2019-08-09 DIAGNOSIS — O34219 Maternal care for unspecified type scar from previous cesarean delivery: Secondary | ICD-10-CM | POA: Diagnosis present

## 2019-08-09 DIAGNOSIS — O9902 Anemia complicating childbirth: Secondary | ICD-10-CM | POA: Diagnosis present

## 2019-08-09 DIAGNOSIS — O99824 Streptococcus B carrier state complicating childbirth: Secondary | ICD-10-CM | POA: Diagnosis present

## 2019-08-09 DIAGNOSIS — O322XX0 Maternal care for transverse and oblique lie, not applicable or unspecified: Secondary | ICD-10-CM

## 2019-08-09 DIAGNOSIS — O98819 Other maternal infectious and parasitic diseases complicating pregnancy, unspecified trimester: Secondary | ICD-10-CM

## 2019-08-09 DIAGNOSIS — O26893 Other specified pregnancy related conditions, third trimester: Secondary | ICD-10-CM | POA: Diagnosis present

## 2019-08-09 DIAGNOSIS — O0990 Supervision of high risk pregnancy, unspecified, unspecified trimester: Secondary | ICD-10-CM

## 2019-08-09 DIAGNOSIS — Z20822 Contact with and (suspected) exposure to covid-19: Secondary | ICD-10-CM | POA: Diagnosis present

## 2019-08-09 DIAGNOSIS — B951 Streptococcus, group B, as the cause of diseases classified elsewhere: Secondary | ICD-10-CM

## 2019-08-09 LAB — CBC
HCT: 39.7 % (ref 36.0–46.0)
Hemoglobin: 13 g/dL (ref 12.0–15.0)
MCH: 28.8 pg (ref 26.0–34.0)
MCHC: 32.7 g/dL (ref 30.0–36.0)
MCV: 88 fL (ref 80.0–100.0)
Platelets: 180 10*3/uL (ref 150–400)
RBC: 4.51 MIL/uL (ref 3.87–5.11)
RDW: 13.6 % (ref 11.5–15.5)
WBC: 10.4 10*3/uL (ref 4.0–10.5)
nRBC: 0 % (ref 0.0–0.2)

## 2019-08-09 LAB — SARS CORONAVIRUS 2 BY RT PCR (HOSPITAL ORDER, PERFORMED IN ~~LOC~~ HOSPITAL LAB): SARS Coronavirus 2: NEGATIVE

## 2019-08-09 LAB — POCT FERN TEST: POCT Fern Test: POSITIVE

## 2019-08-09 LAB — TYPE AND SCREEN
ABO/RH(D): A POS
Antibody Screen: NEGATIVE

## 2019-08-09 MED ORDER — ACETAMINOPHEN 325 MG PO TABS
650.0000 mg | ORAL_TABLET | ORAL | Status: DC | PRN
  Administered 2019-08-10 (×4): 650 mg via ORAL
  Filled 2019-08-09 (×4): qty 2

## 2019-08-09 MED ORDER — ONDANSETRON HCL 4 MG PO TABS: 4.0000 mg | ORAL_TABLET | ORAL | Status: DC | PRN

## 2019-08-09 MED ORDER — OXYCODONE-ACETAMINOPHEN 5-325 MG PO TABS: 1.0000 | ORAL_TABLET | ORAL | Status: DC | PRN

## 2019-08-09 MED ORDER — ONDANSETRON HCL 4 MG/2ML IJ SOLN: 4.0000 mg | INTRAMUSCULAR | Status: DC | PRN

## 2019-08-09 MED ORDER — DIBUCAINE (PERIANAL) 1 % EX OINT: 1.0000 "application " | TOPICAL_OINTMENT | CUTANEOUS | Status: DC | PRN

## 2019-08-09 MED ORDER — LACTATED RINGERS IV SOLN: INTRAVENOUS | Status: DC

## 2019-08-09 MED ORDER — ONDANSETRON HCL 4 MG/2ML IJ SOLN: 4.0000 mg | Freq: Four times a day (QID) | INTRAMUSCULAR | Status: DC | PRN

## 2019-08-09 MED ORDER — SODIUM CHLORIDE 0.9 % IV SOLN
2.0000 g | Freq: Once | INTRAVENOUS | Status: AC
  Administered 2019-08-09: 2 g via INTRAVENOUS
  Filled 2019-08-09: qty 2000

## 2019-08-09 MED ORDER — ACETAMINOPHEN 325 MG PO TABS: 650.0000 mg | ORAL_TABLET | ORAL | Status: DC | PRN

## 2019-08-09 MED ORDER — OXYTOCIN 40 UNITS IN NORMAL SALINE INFUSION - SIMPLE MED
2.5000 [IU]/h | INTRAVENOUS | Status: DC
  Administered 2019-08-09: 2.5 [IU]/h via INTRAVENOUS
  Filled 2019-08-09: qty 1000

## 2019-08-09 MED ORDER — TETANUS-DIPHTH-ACELL PERTUSSIS 5-2.5-18.5 LF-MCG/0.5 IM SUSP: 0.5000 mL | Freq: Once | INTRAMUSCULAR | Status: DC

## 2019-08-09 MED ORDER — SODIUM CHLORIDE (PF) 0.9 % IJ SOLN
INTRAMUSCULAR | Status: DC | PRN
  Administered 2019-08-09: 12 mL/h via EPIDURAL

## 2019-08-09 MED ORDER — LIDOCAINE HCL (PF) 1 % IJ SOLN
INTRAMUSCULAR | Status: DC | PRN
  Administered 2019-08-09: 10 mL via EPIDURAL

## 2019-08-09 MED ORDER — FENTANYL CITRATE (PF) 100 MCG/2ML IJ SOLN
100.0000 ug | INTRAMUSCULAR | Status: DC | PRN
  Administered 2019-08-09: 100 ug via INTRAVENOUS
  Filled 2019-08-09: qty 2

## 2019-08-09 MED ORDER — LIDOCAINE HCL (PF) 1 % IJ SOLN: 30.0000 mL | INTRAMUSCULAR | Status: DC | PRN

## 2019-08-09 MED ORDER — SIMETHICONE 80 MG PO CHEW: 80.0000 mg | CHEWABLE_TABLET | ORAL | Status: DC | PRN

## 2019-08-09 MED ORDER — OXYCODONE-ACETAMINOPHEN 5-325 MG PO TABS: 2.0000 | ORAL_TABLET | ORAL | Status: DC | PRN

## 2019-08-09 MED ORDER — FENTANYL-BUPIVACAINE-NACL 0.5-0.125-0.9 MG/250ML-% EP SOLN
EPIDURAL | Status: AC
  Filled 2019-08-09: qty 250

## 2019-08-09 MED ORDER — SOD CITRATE-CITRIC ACID 500-334 MG/5ML PO SOLN: 30.0000 mL | ORAL | Status: DC | PRN

## 2019-08-09 MED ORDER — IBUPROFEN 600 MG PO TABS
600.0000 mg | ORAL_TABLET | Freq: Four times a day (QID) | ORAL | Status: DC
  Administered 2019-08-09 – 2019-08-11 (×6): 600 mg via ORAL
  Filled 2019-08-09 (×6): qty 1

## 2019-08-09 MED ORDER — ZOLPIDEM TARTRATE 5 MG PO TABS: 5.0000 mg | ORAL_TABLET | Freq: Every evening | ORAL | Status: DC | PRN

## 2019-08-09 MED ORDER — OXYTOCIN BOLUS FROM INFUSION
500.0000 mL | Freq: Once | INTRAVENOUS | Status: AC
  Administered 2019-08-09: 500 mL via INTRAVENOUS

## 2019-08-09 MED ORDER — SENNOSIDES-DOCUSATE SODIUM 8.6-50 MG PO TABS
2.0000 | ORAL_TABLET | ORAL | Status: DC
  Administered 2019-08-10 – 2019-08-11 (×2): 2 via ORAL
  Filled 2019-08-09 (×2): qty 2

## 2019-08-09 MED ORDER — LACTATED RINGERS IV SOLN: 500.0000 mL | INTRAVENOUS | Status: DC | PRN

## 2019-08-09 MED ORDER — BENZOCAINE-MENTHOL 20-0.5 % EX AERO
1.0000 "application " | INHALATION_SPRAY | CUTANEOUS | Status: DC | PRN
  Administered 2019-08-09: 1 via TOPICAL
  Filled 2019-08-09: qty 56

## 2019-08-09 MED ORDER — COCONUT OIL OIL
1.0000 "application " | TOPICAL_OIL | Status: DC | PRN
  Administered 2019-08-10: 1 via TOPICAL

## 2019-08-09 MED ORDER — FLEET ENEMA 7-19 GM/118ML RE ENEM: 1.0000 | ENEMA | RECTAL | Status: DC | PRN

## 2019-08-09 MED ORDER — PRENATAL MULTIVITAMIN CH
1.0000 | ORAL_TABLET | Freq: Every day | ORAL | Status: DC
  Administered 2019-08-10: 1 via ORAL
  Filled 2019-08-09: qty 1

## 2019-08-09 MED ORDER — WITCH HAZEL-GLYCERIN EX PADS: 1.0000 "application " | MEDICATED_PAD | CUTANEOUS | Status: DC | PRN

## 2019-08-09 MED ORDER — DIPHENHYDRAMINE HCL 25 MG PO CAPS: 25.0000 mg | ORAL_CAPSULE | Freq: Four times a day (QID) | ORAL | Status: DC | PRN

## 2019-08-09 NOTE — Discharge Summary (Addendum)
Postpartum Discharge Summary   Patient Name: Tonya [name] DOB: 11-26-1980 MRN: [id number]  Date of admission: 08/09/2019 Delivery date:08/09/2019  Delivering provider: Wende Mott  Date of discharge: 08/11/2019  Admitting diagnosis: Normal labor [O80, Z37.9] Intrauterine pregnancy: [redacted]w[redacted]d    Secondary diagnosis:  Active Problems:   Group B streptococcal infection during pregnancy  Additional problems: Anemia, 2nd Degree Laceration    Discharge diagnosis: Term Pregnancy Delivered and VBAC                                              Post partum procedures:none Augmentation: N/A Complications: None  Hospital course: Onset of Labor With Vaginal Delivery      39y.o. yo [id number] 381w4das admitted in Active Labor on 08/09/2019. Patient had an uncomplicated labor course as follows:  Membrane Rupture Time/Date: 10:45 AM ,08/09/2019   Delivery Method:VBAC, Spontaneous  Episiotomy: None  Lacerations:  2nd degree;Perineal  Patient had an uncomplicated postpartum course.  She is ambulating, tolerating a regular diet, passing flatus, and urinating well. Patient is discharged home in stable condition on 08/11/19.  Newborn Data: Birth date:08/09/2019  Birth time:3:27 PM  Gender:Female  Living status:Living  Apgars:8 ,9   Magnesium Sulfate received: No BMZ received: No Rhophylac:No MMR:No T-DaP:Given prenatally Flu: No Transfusion:No  Physical exam  Vitals:   08/10/19 0631 08/10/19 1459 08/10/19 2200 08/11/19 0533  BP: 111/69 107/73 (!) 103/59 112/84  Pulse: 72 80 73 71  Resp: 17 16 17 17   Temp:  (!) 97.5 F (36.4 C) 98 F (36.7 C) 97.8 F (36.6 C)  TempSrc:  Oral Oral Oral  SpO2:  98% 98% 99%  Weight:      Height:       General: alert, cooperative and no distress Lochia: appropriate Uterine Fundus: firm Incision: N/A DVT Evaluation: No evidence of DVT seen on physical exam. Labs: Lab Results  Component Value Date   WBC 11.9 (H) 08/10/2019   HGB 9.8 (L)  08/10/2019   HCT 29.9 (L) 08/10/2019   MCV 88.7 08/10/2019   PLT 137 (L) 08/10/2019   CMP Latest Ref Rng & Units 09/23/2018  Glucose 70 - 99 mg/dL 101(H)  BUN 6 - 20 mg/dL 6  Creatinine 0.44 - 1.00 mg/dL 0.67  Sodium 135 - 145 mmol/L 139  Potassium 3.5 - 5.1 mmol/L 4.5  Chloride 98 - 111 mmol/L 106  CO2 22 - 32 mmol/L 23  Calcium 8.9 - 10.3 mg/dL 9.5  Total Protein 6.5 - 8.1 g/dL 7.3  Total Bilirubin 0.3 - 1.2 mg/dL 0.5  Alkaline Phos 38 - 126 U/L 42  AST 15 - 41 U/L 17  ALT 0 - 44 U/L 14   Edinburgh Score: Edinburgh Postnatal Depression Scale Screening Tool 08/10/2019  I have been able to laugh and see the funny side of things. (No Data)   After visit meds:  Allergies as of 08/11/2019   No Known Allergies     Medication List    STOP taking these medications   progesterone 200 MG capsule Commonly known as: PROMETRIUM     TAKE these medications   ferrous sulfate 325 (65 FE) MG tablet Take 1 tablet (325 mg total) by mouth every other day.   ibuprofen 600 MG tablet Commonly known as: ADVIL Take 1 tablet (600 mg total) by mouth every 6 (six) hours.  norethindrone 0.35 MG tablet Commonly known as: MICRONOR Take 1 tablet (0.35 mg total) by mouth daily. Start taking at 4 weeks postpartum   PRENATAL VITAMINS PO Take 1 tablet by mouth daily.   senna-docusate 8.6-50 MG tablet Commonly known as: Senokot-S Take 2 tablets by mouth daily. Start taking on: Aug 12, 2019   VITAMIN D PO Take 1 tablet by mouth daily.      Discharge home in stable condition Infant Feeding: Breast Infant Disposition:home with mother Discharge instruction: per After Visit Summary and Postpartum booklet. Activity: Advance as tolerated. Pelvic rest for 6 weeks.  Diet: routine diet Future Appointments: Future Appointments  Date Time Provider Roy  09/16/2019  9:35 AM Nugent, Gerrie Nordmann, NP [facility]   Follow up Visit: [place]              . Go in 4 week(s).   Why: for postpartum visit. Contact information: [place]          Please schedule this patient for Postpartum visit in: 4 weeks with the following provider: Any provider Virtual For C/S patients schedule nurse incision check in weeks 2 weeks: no High risk pregnancy complicated by: previous c/s Delivery mode:  SVD Anticipated Birth Control:  POPs PP Procedures needed: n/a  Schedule Integrated [place] visit: no  [place] Constable, D.O.  PGY-2 Family Medicine  08/11/2019 6:29 AM  Attestation of Supervision of Student:  I confirm that I have verified the information documented in the resident's student's note and that I have also personally reperformed the history, physical exam and all medical decision making activities.  I have verified that all services and findings are accurately documented in this student's note; and I agree with management and plan as outlined in the documentation. I have also made any necessary editorial changes.  --Single episode of shortness of breath overnight. Occurred while patient was waking up and resolved with repositioning to high fowler's. No additional episodes. Vital signs stable, lungs CTAB  --Micronor prescribed at discharge per patient request  Darlina Rumpf, [place] for [employer], [facility] 08/11/2019 10:45 AM

## 2019-08-09 NOTE — MAU Note (Signed)
Tonya [name] is a 39 y.o. at [redacted]w[redacted]d here in MAU reporting:  +contractions/abdominal cramping Onset of complaint: 5am Reports that she has felt about 10 contractions since 5am Pain score: 6-7/10 Denies LOF or vaginal bleeding. Vitals:   08/09/19 0920  BP: 126/76  Pulse: (!) 107  Resp: 18  Temp: 98.1 F (36.7 C)  SpO2: 99%    FHT: +FM; 145 Lab orders placed from triage: mau labor triage

## 2019-08-09 NOTE — Anesthesia Preprocedure Evaluation (Signed)
Anesthesia Evaluation  Patient identified by MRN, date of birth, ID band Patient awake    Reviewed: Allergy & Precautions, H&P , NPO status , Patient's Chart, lab work & pertinent test results  History of Anesthesia Complications Negative for: history of anesthetic complications  Airway Mallampati: II  TM Distance: >3 FB Neck ROM: full    Dental no notable dental hx.    Pulmonary neg pulmonary ROS,    Pulmonary exam normal        Cardiovascular negative cardio ROS Normal cardiovascular exam Rhythm:regular Rate:Normal     Neuro/Psych negative neurological ROS  negative psych ROS   GI/Hepatic negative GI ROS, Neg liver ROS,   Endo/Other  Morbid obesityPCOS  Renal/GU      Musculoskeletal   Abdominal   Peds  Hematology negative hematology ROS (+)   Anesthesia Other Findings   Reproductive/Obstetrics (+) Pregnancy                             Anesthesia Physical Anesthesia Plan  ASA: III  Anesthesia Plan: Epidural   Post-op Pain Management:    Induction:   PONV Risk Score and Plan:   Airway Management Planned:   Additional Equipment:   Intra-op Plan:   Post-operative Plan:   Informed Consent: I have reviewed the patients History and Physical, chart, labs and discussed the procedure including the risks, benefits and alternatives for the proposed anesthesia with the patient or authorized representative who has indicated his/her understanding and acceptance.       Plan Discussed with:   Anesthesia Plan Comments:         Anesthesia Quick Evaluation

## 2019-08-09 NOTE — Anesthesia Procedure Notes (Signed)
Epidural Patient location during procedure: OB Start time: 08/09/2019 12:03 PM End time: 08/09/2019 12:13 PM  Staffing Anesthesiologist: Lucretia Kern, MD Performed: anesthesiologist   Preanesthetic Checklist Completed: patient identified, IV checked, risks and benefits discussed, monitors and equipment checked, pre-op evaluation and timeout performed  Epidural Patient position: sitting Prep: DuraPrep Patient monitoring: heart rate, continuous pulse ox and blood pressure Approach: midline Location: L3-L4 Injection technique: LOR air  Needle:  Needle type: Tuohy  Needle gauge: 17 G Needle length: 9 cm Needle insertion depth: 6 cm Catheter type: closed end flexible Catheter size: 19 Gauge Catheter at skin depth: 11 cm Test dose: negative  Assessment Events: blood not aspirated, injection not painful, no injection resistance, no paresthesia and negative IV test  Additional Notes Reason for block:procedure for pain

## 2019-08-09 NOTE — Lactation Note (Signed)
This note was copied from a baby's chart. Lactation Consultation Note  Patient Name: Tonya [name] KCLEX'N Date: 08/09/2019 Reason for consult: Initial assessment;Early term 37-38.6wks P2, 8 hour ETI female infant. Mom with hx:  AMA, PCOS, C/S had VBAC delivery hx preterm births at ( 32 and [redacted] weeks gestation).  Per mom, she has been having pain with latches unsure of how long infant breastfeeds not been keeping up with time of feedings. LC discussed importance with parents of recording BF length and duration as well as infant input and output ( stools and voids).  Per mom, she has DEBP at home and is active on the [facility] in [place]. Mom is experienced at breastfeeding she breastfeed her 58 year old son for 23 months. LC did breast assessment and notice mom has flat nipples that does respond to breast stimulation, mom was given hand pump to pre-pump prior to latching infant at breast. Mom was given comfort gels to wear in Nursing bra  due to abrasions on both breast due to previous poor and shallow latches.  Mom did breast stimulation prior to latching infant on her right breast with pillow support, LC ask mom to wait until infant's tongue is down, mouth wide and bring infant to breast, chin first, nose and chin touching breast. Infant had deep latch, per mom, she is little sore but can tell a difference with this latch, not painful, infant was swallowing, still breastfeeding after 10 minutes when LC left the room. Mom knows to breastfeed infant according to hunger cues, 8 to 12+ times within 24 hours and not to exceed 3 hours without breastfeeding infant. Mom knows to call RN or LC if she needs assistance with latching infant at breast. Mom will pre-pump breast prior to latching infant at breast. Parents will continue to do as much STS as possible. Reviewed Baby & Me book's Breastfeeding Basics.  Mom made aware of O/P services, breastfeeding support groups, community resources,  and our phone # for post-discharge questions.  Maternal Data Formula Feeding for Exclusion: No Has patient been taught Hand Expression?: Yes Does the patient have breastfeeding experience prior to this delivery?: Yes  Feeding Feeding Type: Breast Fed  LATCH Score Latch: Grasps breast easily, tongue down, lips flanged, rhythmical sucking.  Audible Swallowing: Spontaneous and intermittent  Type of Nipple: Flat  Comfort (Breast/Nipple): Filling, red/small blisters or bruises, mild/mod discomfort  Hold (Positioning): Assistance needed to correctly position infant at breast and maintain latch.  LATCH Score: 7  Interventions Interventions: Breast feeding basics reviewed;Breast compression;Assisted with latch;Adjust position;Skin to skin;Support pillows;Hand pump;Comfort gels;Position options;Breast massage;Hand express;Expressed milk;Pre-pump if needed  Lactation Tools Discussed/Used Tools: Pump Breast pump type: Manual WIC Program: Yes Pump Review: Setup, frequency, and cleaning;Milk Storage Initiated by:: Danelle Earthly, IBCLC Date initiated:: 08/09/19   Consult Status Consult Status: Follow-up Date: 08/10/19 Follow-up type: In-patient    Danelle Earthly 08/09/2019, 11:44 PM

## 2019-08-09 NOTE — H&P (Signed)
OBSTETRIC ADMISSION HISTORY AND PHYSICAL  Vincie Linn is a 39 y.o. female [phone] with IUP at [redacted]w[redacted]d by LMP presenting for spontaneous onset of labor and SROM. She reports +FMs, no VB, no blurry vision, headaches or peripheral edema, and RUQ pain.  She plans on breast feeding. She request IUD for birth control. She received her prenatal care at [facility]   Dating: By LMP --->  Estimated Date of Delivery: 08/19/19   Nursing Staff Provider  Office Location  [facility] Dating    Language   Portuguese Anatomy US    Flu Vaccine   03/25/19 Genetic Screen  NIPS:   AFP:   First Screen:  Quad:    TDaP vaccine   06/05/18 Hgb A1C or  GTT Early  Third trimester neg  Rhogam   NA   LAB RESULTS   Feeding Plan  Breast Blood Type --/--/A POS (06/29 1204)   Contraception  Liletta Antibody  neg  Circumcision  Girl Rubella    Pediatrician   [facility] RPR   neg  Support Person  Husband  HBsAg     Prenatal Classes   HIV    BTL Consent   GBS  (For PCN allergy, check sensitivities)   VBAC Consent  signed 04/07/19 Pap     Hgb Electro    BP Cuff  given 04/07/19 CF     SMA     Waterbirth  [ ]  Class [ ]  Consent [ ]  CNM visit   Prenatal History/Complications: TOLAC, hx neonatal demise after preterm delivery, AMA, GBS pos  Past Medical History: Past Medical History:  Diagnosis Date  . Endometriosis    patient states has hx of endometriosis  . PCOS (polycystic ovarian syndrome)    PCOS    Past Surgical History: Past Surgical History:  Procedure Laterality Date  . CESAREAN SECTION  2000    Obstetrical History: OB History    Gravida  6   Para  2   Term  0   Preterm  2   AB  3   Living  1     SAB  3   TAB      Ectopic      Multiple      Live Births  1           Social History Social History   Socioeconomic History  . Marital status: Married    Spouse name: Not on file  . Number of children: Not on file  . Years of education: Not on file  . Highest  education level: Not on file  Occupational History  . Not on file  Tobacco Use  . Smoking status: Never Smoker  . Smokeless tobacco: Never Used  Substance and Sexual Activity  . Alcohol use: Not Currently  . Drug use: Never  . Sexual activity: Yes    Partners: Male    Birth control/protection: None  Other Topics Concern  . Not on file  Social History Narrative  . Not on file   Social Determinants of Health   Financial Resource Strain:   . Difficulty of Paying Living Expenses:   Food Insecurity: No Food Insecurity  . Worried About in the Last Year: Never true  . Ran Out of Food in the Last Year: Never true  Transportation Needs: No Transportation Needs  . Lack of Transportation (Medical): No  . Lack of Transportation (Non-Medical): No  Physical Activity:   . Days of Exercise  per Week:   . Minutes of Exercise per Session:   Stress:   . Feeling of Stress :   Social Connections:   . Frequency of Communication with Friends and Family:   . Frequency of Social Gatherings with Friends and Family:   . Attends Religious Services:   . Active Member of Clubs or Organizations:   . Attends Archivist Meetings:   Marland Kitchen Marital Status:     Family History: Family History  Problem Relation Age of Onset  . Hyperlipidemia Mother   . Thyroid disease Mother        hypothyroid  . Depression Mother   . Diabetes Father   . Hypertension Father   . Heart attack Father 21       dec from heart attack  . Thyroid disease Brother        hypothyroid    Allergies: No Known Allergies  Medications Prior to Admission  Medication Sig Dispense Refill Last Dose  . Prenatal Vit-Fe Fumarate-FA (PRENATAL VITAMINS PO) Take 1 tablet by mouth daily.     . progesterone (PROMETRIUM) 200 MG capsule Place one capsule vaginally at bedtime 30 capsule 3   . VITAMIN D PO Take 1 tablet by mouth daily.        Review of Systems   All systems reviewed and negative except as  stated in HPI  Blood pressure 126/76, pulse (!) 107, temperature 98.1 F (36.7 C), temperature source Oral, resp. rate 18, last menstrual period 11/12/2018, SpO2 99 %, unknown if currently breastfeeding. General appearance: alert, cooperative and mild distress Lungs: clear to auscultation bilaterally Heart: regular rate and rhythm Abdomen: soft, non-tender; bowel sounds normal Pelvic: n/a Extremities: Homans sign is negative, no sign of DVT DTR's +2 Presentation: cephalic Fetal monitoringBaseline: 145 bpm, Variability: Good {> 6 bpm), Accelerations: Reactive and Decelerations: Absent Uterine activityFrequency: Every 5-6 minutes Dilation: 5.5 Effacement (%): 80 Station: -2 Exam by:: Erasmo Score RN   Prenatal labs: ABO, Rh: A/Positive/-- (12/30 1201) Antibody: Negative (12/30 1201) Rubella:   RPR: Non Reactive (03/11 0829)  HBsAg: Negative (12/30 0812)  HIV: Non Reactive (03/11 0829)  GBS: Positive/-- (04/27 1210)  Prenatal Transfer Tool  Maternal Diabetes: No Genetic Screening: Normal Maternal Ultrasounds/Referrals: Normal Fetal Ultrasounds or other Referrals:  None Maternal Substance Abuse:  No Significant Maternal Medications:  None Significant Maternal Lab Results: Group B Strep positive  Results for orders placed or performed during the hospital encounter of 08/09/19 (from the past 24 hour(s))  Fern Test   Collection Time: 08/09/19 10:51 AM  Result Value Ref Range   POCT Fern Test Positive = ruptured amniotic membanes     Patient Active Problem List   Diagnosis Date Noted  . AMA (advanced maternal age) multigravida 35+ 06/05/2019  . Preterm delivery 04/07/2019  . History of VBAC 04/07/2019  . History of fetal anomaly in prior pregnancy, currently pregnant 04/07/2019  . Supervision of high risk pregnancy, antepartum 04/02/2019    Assessment/Plan:  Doreen Garretson is a 39 y.o. [id number] at [redacted]w[redacted]d here for spontaneous onset of labor, SROM while in  MAU.  Patient desires TOLAC. First pregnancy was c/s in 2000 for FGR and patient has had successful VBAC since. TOLAC consent signed 1/11.  #Labor: expectant management for now #Pain: epidural #FWB: Cat 1 #ID:  GBS pos, amp #MOF: Breast #MOC: IUD #Circ:  [place], CNM  08/09/2019, 10:54 AM

## 2019-08-10 ENCOUNTER — Encounter: Payer: Self-pay | Admitting: Student

## 2019-08-10 LAB — RPR: RPR Ser Ql: NONREACTIVE

## 2019-08-10 LAB — RUBELLA SCREEN: Rubella: 7.45 index (ref >0.99)

## 2019-08-10 LAB — CBC
HCT: 29.9 % — ABNORMAL LOW (ref 36.0–46.0)
Hemoglobin: 9.8 g/dL — ABNORMAL LOW (ref 12.0–15.0)
MCH: 29.1 pg (ref 26.0–34.0)
MCHC: 32.8 g/dL (ref 30.0–36.0)
MCV: 88.7 fL (ref 80.0–100.0)
Platelets: 137 10*3/uL — ABNORMAL LOW (ref 150–400)
RBC: 3.37 MIL/uL — ABNORMAL LOW (ref 3.87–5.11)
RDW: 13.9 % (ref 11.5–15.5)
WBC: 11.9 10*3/uL — ABNORMAL HIGH (ref 4.0–10.5)
nRBC: 0 % (ref 0.0–0.2)

## 2019-08-10 MED ORDER — FERROUS SULFATE 325 (65 FE) MG PO TABS
325.0000 mg | ORAL_TABLET | Freq: Every day | ORAL | Status: DC
  Administered 2019-08-10 – 2019-08-11 (×2): 325 mg via ORAL
  Filled 2019-08-10 (×2): qty 1

## 2019-08-10 NOTE — Progress Notes (Signed)
Post Partum Day 1 Subjective: Patient reports feeling well. She is tolerating PO. Ambulating and urinating without difficulty. Lochia minimal.  Objective: Blood pressure 111/69, pulse 72, temperature 98.2 F (36.8 C), temperature source Oral, resp. rate 17, height 5\' 2"  (1.575 m), weight 99.4 kg, last menstrual period 11/12/2018, SpO2 97 %, unknown if currently breastfeeding.  Physical Exam:  General: alert, cooperative and appears stated age Lochia: appropriate Uterine Fundus: firm Incision: NA DVT Evaluation: No evidence of DVT seen on physical exam.  Recent Labs    08/09/19 1123 08/10/19 0717  HGB 13.0 9.8*  HCT 39.7 29.9*    Assessment/Plan: Plan for discharge tomorrow (baby has to stay for an 08/12/19) PO iron initiated POPs on discharge Breastfeeding Vitals stable    LOS: 1 day   [place] 08/10/2019, 12:45 PM

## 2019-08-10 NOTE — Anesthesia Postprocedure Evaluation (Signed)
Anesthesia Post Note  Patient: Tonya [name]  Procedure(s) Performed: AN AD HOC LABOR EPIDURAL     Patient location during evaluation: Mother Baby Anesthesia Type: Epidural Level of consciousness: awake and alert Pain management: pain level controlled Vital Signs Assessment: post-procedure vital signs reviewed and stable Respiratory status: spontaneous breathing, nonlabored ventilation and respiratory function stable Cardiovascular status: stable Postop Assessment: no headache, no backache and epidural receding Anesthetic complications: no    Last Vitals:  Vitals:   08/10/19 0317 08/10/19 0631  BP: (!) 98/48 111/69  Pulse: 69 72  Resp: 17 17  Temp:    SpO2:      Last Pain:  Vitals:   08/10/19 0103  TempSrc:   PainSc: 2    Pain Goal: Patients Stated Pain Goal: 0 (08/09/19 0914)                 Junious Silk

## 2019-08-10 NOTE — Lactation Note (Signed)
This note was copied from a baby's chart. Lactation Consultation Note  Patient Name: Tonya [name] [id number] Date: 08/10/2019     Infant BF when LC entered room.  Mom states BF is going much better today and she is not having pain with feeding.  LC noted abrasion on right nipple, left nipple appeared slightly red.  Mom has been using coconut oil.  Infant was sleeping; waking techniques discussed.  Infant had been feeding 20 minutes when LC arrived.  Nipple was rounded with slight indentation to one side of nipple but not pinched.    LC changed wet diaper and infant urinated again with diaper change.  Mom placed infant in football hold.  She prefers cradle but was willing to try football with this feed on right side.  Mom hand expressed prior to latch; drops expressed easily.   Infant latched well with wide gape and flanged lips.  Infant fell asleep 2 minutes after latching.  Mom used massage to wake infant.  A couple of swallows heard.  Mom  Continued to BF when LC left room.   BF Basics reviewed with mom.  Feeding cues and waking techniques encouraged along with hand expression.  Mom is aware of lactation support and services after DC.   Maternal Data    Feeding Feeding Type: Breast Fed  LATCH Score Latch: Grasps breast easily, tongue down, lips flanged, rhythmical sucking.  Audible Swallowing: A few with stimulation  Type of Nipple: Everted at rest and after stimulation  Comfort (Breast/Nipple): Filling, red/small blisters or bruises, mild/mod discomfort  Hold (Positioning): No assistance needed to correctly position infant at breast.  LATCH Score: 8  Interventions Interventions: Breast feeding basics reviewed;Assisted with latch;Skin to skin;Breast massage;Hand express;Support pillows;Adjust position  Lactation Tools Discussed/Used     Consult Status Consult Status: Follow-up Date: 08/11/19 Follow-up type: In-patient    Maryruth Hancock [facility] 08/10/2019, 4:16  PM

## 2019-08-11 MED ORDER — IBUPROFEN 600 MG PO TABS: 600.0000 mg | ORAL_TABLET | Freq: Four times a day (QID) | ORAL | 0 refills | Status: AC

## 2019-08-11 MED ORDER — NORETHINDRONE 0.35 MG PO TABS: 1.0000 | ORAL_TABLET | Freq: Every day | ORAL | 11 refills | Status: AC

## 2019-08-11 MED ORDER — FERROUS SULFATE 325 (65 FE) MG PO TABS: 325.0000 mg | ORAL_TABLET | ORAL | 3 refills | Status: AC

## 2019-08-11 MED ORDER — SENNOSIDES-DOCUSATE SODIUM 8.6-50 MG PO TABS: 2.0000 | ORAL_TABLET | ORAL | 3 refills | Status: AC

## 2019-08-11 NOTE — Progress Notes (Deleted)
POSTPARTUM PROGRESS NOTE  Post Partum Day #2: S/P spontaneous VBAC with 2nd degree Perineal Laceration  Subjective:  Zalayah Pizzuto is a 39 y.o. [id number] [redacted]w[redacted]d PPD #2 s/p VBAC with 2nd degree Perineal Laceration.  No acute events overnight.  Pt denies problems with ambulating, voiding or po intake.  She denies nausea or vomiting.  She did endorse transient shortness of breath while laying flat that resolved spontaneously after a few seconds. Pain is well controlled.  She has had flatus. She has had bowel movement.  Lochia Small.   Objective: Blood pressure 112/84, pulse 71, temperature 97.8 F (36.6 C), temperature source Oral, resp. rate 17, height 5\' 2"  (1.575 m), weight 99.4 kg, last menstrual period 11/12/2018, SpO2 99 %, unknown if currently breastfeeding.  Physical Exam:  General: alert, cooperative and no distress Lochia:normal flow Chest: CTAB Heart: RRR no m/r/g Abdomen: +BS, soft, nontender,  Uterine Fundus: firm DVT Evaluation: No calf swelling or tenderness Extremities: No edema  Recent Labs    08/09/19 1123 08/10/19 0717  HGB 13.0 9.8*  HCT 39.7 29.9*    Assessment/Plan:  ASSESSMENT: Lyndia Bury is a 39 y.o. 20 [redacted]w[redacted]d s/p spontaneous VBAC. - Discharge today - Continue PO iron - Contraception: OCPs as outpatient, discussed with patient.  She denies questions or concerns. - Breastfeeding - Vitals are stable - No shortness of breath on exam, breath sounds normal, VSS WNL with pulse 71.  Continue to monitor.     LOS: 2 days   [redacted]w[redacted]d, DO 08/11/2019, 6:22 AM

## 2019-08-11 NOTE — Lactation Note (Signed)
This note was copied from a baby's chart. Lactation Consultation Note  Patient Name: Tonya [name] [id number] Date: 08/11/2019 Reason for consult: Follow-up assessment;Early term 49-38.6wks  P2 mother whose infant is now 63 hours old.  This is an ETI at 38+4 weeks with a 7% weight loss this morning.  Offered to assist with latching and mother agreeable.  Reviewed breast feeding basics.  Mother was eager to use the cradle hold and baby was not supported effectively.  Asked permission to try the cross cradle hold and mother was willing to try.  Mother's breasts are soft and non tender and nipples are everted and intact.  Mother has been using coconut oil for comfort.  Assisted baby to latch easily in the cross cradle hold.  Showed mother proper hand placement and breast compressions.  Baby was quite sleepy, however, once I demonstrated how to keep her awake she began to feed better.  She required stimulation to keep sucking effectively.  Mother denied pain with feeding.  Observed her feeding for 15 minutes.  Demonstrated burping and baby showed feeding cues again.  Mother was interested in trying the football hold on the opposite breast.  Assisted baby to latch and she began sucking with more enthusiasm this time.  Reminded parents to keep her stimulated and to not let her fall asleep at the breast.  Discussed the 7% weight loss and suggested mother post pump at home and feed back any EBM she obtains to baby to help minimize further weight loss and conserve energy level.  Mother verbalized understanding.  Baby has not supplemented at all in the hospital.  Engorgement prevention/treatment discussed.  Mother has a manual pump and a DEBP for home use.  Father present and supportive.  Father has the belongings packed and ready for discharge; awaiting the discharge order.  Family has our OP phone number for questions after discharge.   Maternal Data Formula Feeding for Exclusion: No Has patient been  taught Hand Expression?: Yes Does the patient have breastfeeding experience prior to this delivery?: Yes  Feeding Feeding Type: Breast Fed  LATCH Score Latch: Grasps breast easily, tongue down, lips flanged, rhythmical sucking.  Audible Swallowing: A few with stimulation  Type of Nipple: Everted at rest and after stimulation  Comfort (Breast/Nipple): Soft / non-tender  Hold (Positioning): Assistance needed to correctly position infant at breast and maintain latch.  LATCH Score: 8  Interventions Interventions: Breast feeding basics reviewed;Assisted with latch;Skin to skin;Breast massage;Hand express;Breast compression;Adjust position;Position options;Support pillows  Lactation Tools Discussed/Used     Consult Status Consult Status: Complete Date: 08/11/19 Follow-up type: Call as needed    [name] R [name] 08/11/2019, 9:30 AM

## 2019-08-11 NOTE — Discharge Instructions (Signed)

## 2019-08-12 ENCOUNTER — Encounter: Payer: Medicaid Other | Admitting: Women's Health

## 2019-08-19 ENCOUNTER — Inpatient Hospital Stay (HOSPITAL_COMMUNITY): Admission: RE | Admit: 2019-08-19 | Payer: Medicaid Other | Source: Home / Self Care

## 2019-09-16 ENCOUNTER — Ambulatory Visit (INDEPENDENT_AMBULATORY_CARE_PROVIDER_SITE_OTHER): Payer: Medicaid Other | Admitting: Women's Health

## 2019-09-16 ENCOUNTER — Other Ambulatory Visit: Payer: Self-pay

## 2019-09-16 ENCOUNTER — Encounter: Payer: Self-pay | Admitting: Women's Health

## 2019-09-16 DIAGNOSIS — Z1389 Encounter for screening for other disorder: Secondary | ICD-10-CM | POA: Diagnosis not present

## 2019-09-16 DIAGNOSIS — O99893 Other specified diseases and conditions complicating puerperium: Secondary | ICD-10-CM

## 2019-09-16 DIAGNOSIS — K5900 Constipation, unspecified: Secondary | ICD-10-CM

## 2019-09-16 NOTE — Progress Notes (Signed)
    Post Partum Visit Note  Tonya [name] is a 39 y.o. [phone] female who presents for a postpartum visit. She is 5 weeks postpartum following a SVD.  I have fully reviewed the prenatal and intrapartum course. The delivery was at [redacted]w[redacted]d gestational weeks.  Anesthesia: epidural. Postpartum course has been  unremarkable. Baby is doing well. Baby is feeding by breast. Bleeding no bleeding. Bowel function is abnormal: constipation. Bladder function is normal. Patient is not sexually active. Contraception method is oral progesterone-only contraceptive. Postpartum depression screening: Negative.  The following portions of the patient's history were reviewed and updated as appropriate: allergies, current medications, past family history, past medical history, past social history, past surgical history and problem list.  Review of Systems Pertinent items noted in HPI and remainder of comprehensive ROS otherwise negative.    Objective:  Blood pressure 92/62, pulse 67, last menstrual period 11/12/2018, unknown if currently breastfeeding.  General:  alert, cooperative and no distress   Breasts:  not examined  Lungs: clear to auscultation bilaterally  Heart:  regular rate and rhythm, S1, S2 normal, no murmur, click, rub or gallop  Abdomen: soft, non-tender; bowel sounds normal; no masses,  no organomegaly   Vulva:  not evaluated  Vagina: not evaluated  Cervix:  not evaluated  Corpus: not examined  Adnexa:  not evaluated  Rectal Exam: Not performed.        Assessment:    Normal postpartum exam. Pap smear 03/25/2019 - normal.  Plan:   Essential components of care per ACOG recommendations:  1.  Mood and well being: Patient with negative depression screening today. Reviewed local resources for support.  - Patient does not use tobacco. - hx of drug use? No  - constipation discussed, patient reports eating a lot of white rice, discussed diet changes, increased fluids, exercise, pt to return to  primary care if continued problems, patient reports using bathroom every other day, which is normal for her  2. Infant care and feeding:  -Patient currently breastmilk feeding? Yes -Social determinants of health (SDOH) reviewed in EPIC. No concerns  3. Sexuality, contraception and birth spacing - Patient does not want a pregnancy in the next year.  Desired family size is 2 children.  - Reviewed forms of contraception in tiered fashion. Patient desired oral progesterone-only contraceptive today.   - Discussed birth spacing of 18 months  4. Sleep and fatigue -Encouraged family/partner/community support of 4 hrs of uninterrupted sleep to help with mood and fatigue  5. Physical Recovery  - Discussed patients delivery and complications - Patient has urinary incontinence? No  - Patient is safe to resume physical and sexual activity  6.  Health Maintenance - Last pap smear done 02/2018 and was normal with negative HPV.  7. Chronic Disease - PCP follow up  Marylen Ponto, NP [facility] [employer], [facility]

## 2019-09-16 NOTE — Patient Instructions (Addendum)
Norethindrone tablets (contraception) What is this medicine? NORETHINDRONE (nor eth IN drone) is an oral contraceptive. The product contains a female hormone known as a progestin. It is used to prevent pregnancy. This medicine may be used for other purposes; ask your health care provider or pharmacist if you have questions. COMMON BRAND NAME(S): Camila, Deblitane 28-Day, Errin, Heather, Jencycla, Jolivette, Lyza, Nor-QD, Nora-BE, Norlyroc, Ortho Micronor, Sharobel 28-Day What should I tell my health care provider before I take this medicine? They need to know if you have any of these conditions:  blood vessel disease or blood clots  breast, cervical, or vaginal cancer  diabetes  heart disease  kidney disease  liver disease  mental depression  migraine  seizures  stroke  vaginal bleeding  an unusual or allergic reaction to norethindrone, other medicines, foods, dyes, or preservatives  pregnant or trying to get pregnant  breast-feeding How should I use this medicine? Take this medicine by mouth with a glass of water. You may take it with or without food. Follow the directions on the prescription label. Take this medicine at the same time each day and in the order directed on the package. Do not take your medicine more often than directed. Contact your pediatrician regarding the use of this medicine in children. Special care may be needed. This medicine has been used in female children who have started having menstrual periods. A patient package insert for the product will be given with each prescription and refill. Read this sheet carefully each time. The sheet may change frequently. Overdosage: If you think you have taken too much of this medicine contact a poison control center or emergency room at once. NOTE: This medicine is only for you. Do not share this medicine with others. What if I miss a dose? Try not to miss a dose. Every time you miss a dose or take a dose late  your chance of pregnancy increases. When 1 pill is missed (even if only 3 hours late), take the missed pill as soon as possible and continue taking a pill each day at the regular time (use a back up method of birth control for the next 48 hours). If more than 1 dose is missed, use an additional birth control method for the rest of your pill pack until menses occurs. Contact your health care professional if more than 1 dose has been missed. What may interact with this medicine? Do not take this medicine with any of the following medications:  amprenavir or fosamprenavir  bosentan This medicine may also interact with the following medications:  antibiotics or medicines for infections, especially rifampin, rifabutin, rifapentine, and griseofulvin, and possibly penicillins or tetracyclines  aprepitant  barbiturate medicines, such as phenobarbital  carbamazepine  felbamate  modafinil  oxcarbazepine  phenytoin  ritonavir or other medicines for HIV infection or AIDS  St. John's wort  topiramate This list may not describe all possible interactions. Give your health care provider a list of all the medicines, herbs, non-prescription drugs, or dietary supplements you use. Also tell them if you smoke, drink alcohol, or use illegal drugs. Some items may interact with your medicine. What should I watch for while using this medicine? Visit your doctor or health care professional for regular checks on your progress. You will need a regular breast and pelvic exam and Pap smear while on this medicine. Use an additional method of birth control during the first cycle that you take these tablets. If you have any reason to think you   are pregnant, stop taking this medicine right away and contact your doctor or health care professional. If you are taking this medicine for hormone related problems, it may take several cycles of use to see improvement in your condition. This medicine does not protect you  against HIV infection (AIDS) or any other sexually transmitted diseases. What side effects may I notice from receiving this medicine? Side effects that you should report to your doctor or health care professional as soon as possible:  breast tenderness or discharge  pain in the abdomen, chest, groin or leg  severe headache  skin rash, itching, or hives  sudden shortness of breath  unusually weak or tired  vision or speech problems  yellowing of skin or eyes Side effects that usually do not require medical attention (report to your doctor or health care professional if they continue or are bothersome):  changes in sexual desire  change in menstrual flow  facial hair growth  fluid retention and swelling  headache  irritability  nausea  weight gain or loss This list may not describe all possible side effects. Call your doctor for medical advice about side effects. You may report side effects to FDA at 1-800-FDA-1088. Where should I keep my medicine? Keep out of the reach of children. Store at room temperature between 15 and 30 degrees C (59 and 86 degrees F). Throw away any unused medicine after the expiration date. NOTE: This sheet is a summary. It may not cover all possible information. If you have questions about this medicine, talk to your doctor, pharmacist, or health care provider.  2020 Elsevier/Gold Standard (2011-12-01 16:41:35)       Constipation, Adult Constipation is when a person has fewer bowel movements in a week than normal, has difficulty having a bowel movement, or has stools that are dry, hard, or larger than normal. Constipation may be caused by an underlying condition. It may become worse with age if a person takes certain medicines and does not take in enough fluids. Follow these instructions at home: Eating and drinking   Eat foods that have a lot of fiber, such as fresh fruits and vegetables, whole grains, and beans.  Limit foods that are  high in fat, low in fiber, or overly processed, such as french fries, hamburgers, cookies, candies, and soda.  Drink enough fluid to keep your urine clear or pale yellow. General instructions  Exercise regularly or as told by your health care provider.  Go to the restroom when you have the urge to go. Do not hold it in.  Take over-the-counter and prescription medicines only as told by your health care provider. These include any fiber supplements.  Practice pelvic floor retraining exercises, such as deep breathing while relaxing the lower abdomen and pelvic floor relaxation during bowel movements.  Watch your condition for any changes.  Keep all follow-up visits as told by your health care provider. This is important. Contact a health care provider if:  You have pain that gets worse.  You have a fever.  You do not have a bowel movement after 4 days.  You vomit.  You are not hungry.  You lose weight.  You are bleeding from the anus.  You have thin, pencil-like stools. Get help right away if:  You have a fever and your symptoms suddenly get worse.  You leak stool or have blood in your stool.  Your abdomen is bloated.  You have severe pain in your abdomen.  You feel dizzy or  you faint. This information is not intended to replace advice given to you by your health care provider. Make sure you discuss any questions you have with your health care provider. Document Revised: 02/23/2017 Document Reviewed: 09/01/2015 Elsevier Patient Education  2020 [employer].

## 2019-10-03 ENCOUNTER — Encounter: Payer: Self-pay | Admitting: Student

## 2019-11-05 ENCOUNTER — Encounter: Payer: Self-pay | Admitting: Student

## 2019-11-24 ENCOUNTER — Encounter: Payer: Self-pay | Admitting: Family Medicine

## 2019-11-24 ENCOUNTER — Other Ambulatory Visit: Payer: Self-pay

## 2019-11-24 ENCOUNTER — Ambulatory Visit (INDEPENDENT_AMBULATORY_CARE_PROVIDER_SITE_OTHER): Payer: Medicaid Other | Admitting: Family Medicine

## 2019-11-24 VITALS — BP 106/74 | HR 73 | Ht 63.0 in | Wt 206.0 lb

## 2019-11-24 DIAGNOSIS — Z30017 Encounter for initial prescription of implantable subdermal contraceptive: Secondary | ICD-10-CM | POA: Diagnosis not present

## 2019-11-24 LAB — POCT PREGNANCY, URINE: Preg Test, Ur: NEGATIVE

## 2019-11-24 MED ORDER — ETONOGESTREL 68 MG ~~LOC~~ IMPL
68.0000 mg | DRUG_IMPLANT | Freq: Once | SUBCUTANEOUS | Status: AC
  Administered 2019-11-24: 68 mg via SUBCUTANEOUS

## 2019-11-24 NOTE — Progress Notes (Signed)
Nexplanon Insertion:  Patient given informed consent, signed copy in the chart, time out was performed. Pregnancy test was neg. Appropriate time out taken.  Patient's left arm was prepped and draped in the usual sterile fashion.. The ruler used to measure and mark insertion area.  Pt was prepped with alcohol swab and then injected with 43mL 1% lidocaine with epinephrine.  Pt was prepped with betadine, Implanon removed form packaging,  Device confirmed in needle, then inserted full length of needle and withdrawn per handbook instructions.  Device palpated by physician and patient.  Pt insertion site covered with pressure dressing.   Minimal blood loss.  Pt tolerated the procedure well.

## 2019-11-24 NOTE — Addendum Note (Signed)
Addended by: Olene Craven B on: 11/24/2019 04:58 PM   Modules accepted: Orders

## 2019-11-24 NOTE — Patient Instructions (Signed)
Nexplanon Instructions After Insertion  Keep bandage clean and dry for 24 hours  May use ice/Tylenol/Ibuprofen for soreness or pain  If you develop fever, drainage or increased warmth from incision site-contact office immediately   

## 2019-12-04 ENCOUNTER — Encounter: Payer: Self-pay | Admitting: General Practice

## 2020-02-12 ENCOUNTER — Encounter: Payer: Self-pay | Admitting: *Deleted

## 2020-10-10 IMAGING — US US MFM OB DETAIL+14 WK
1 series · 12 of 28 positions shown · non-contrast
Comparison: none

[Series 1: us mfm ob detail+14 wk · 12 of 87 slices shown]
[im 4/87]
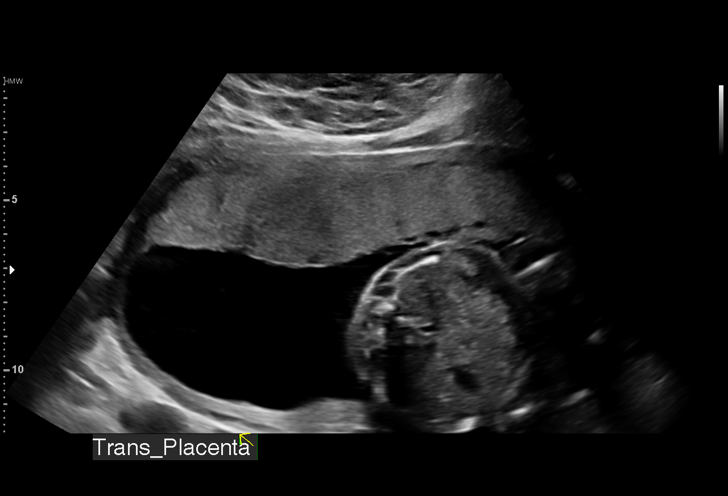
[im 10/87]
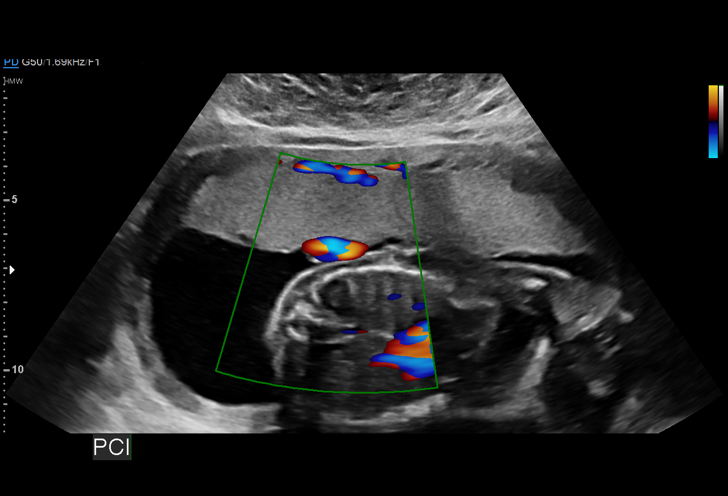
[im 16/87]
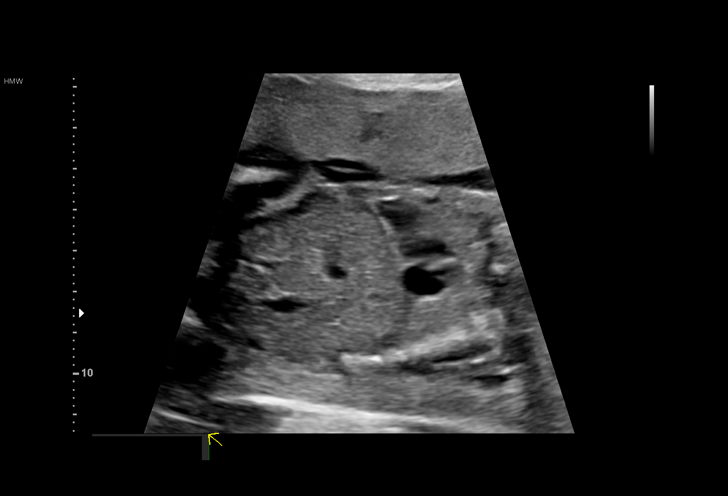
[im 26/87]
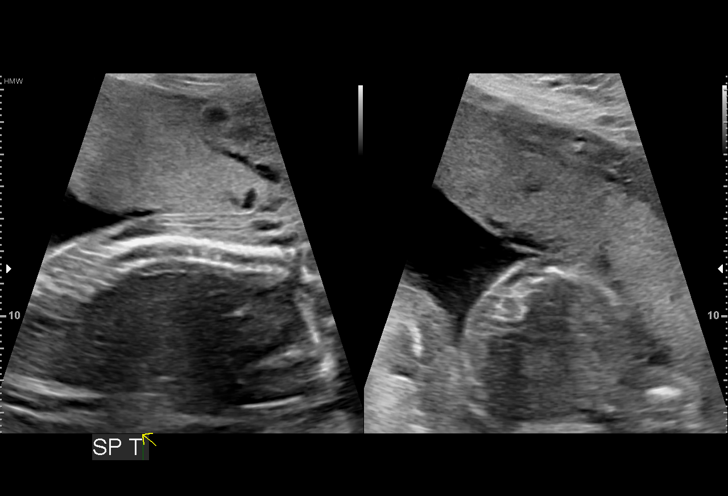
[im 32/87]
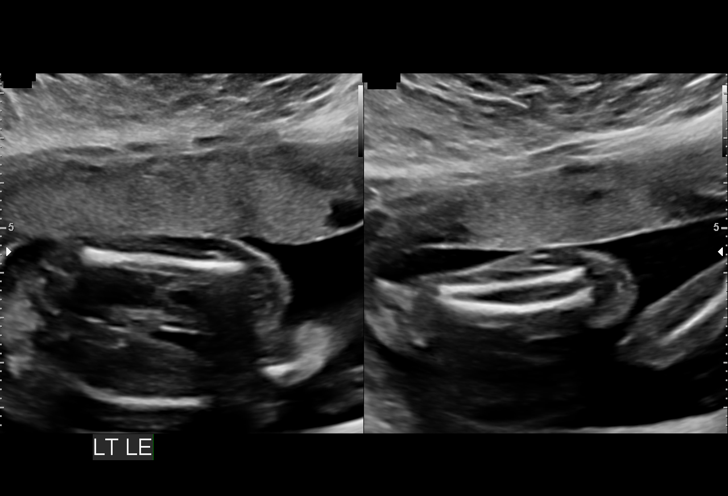
[im 39/87]
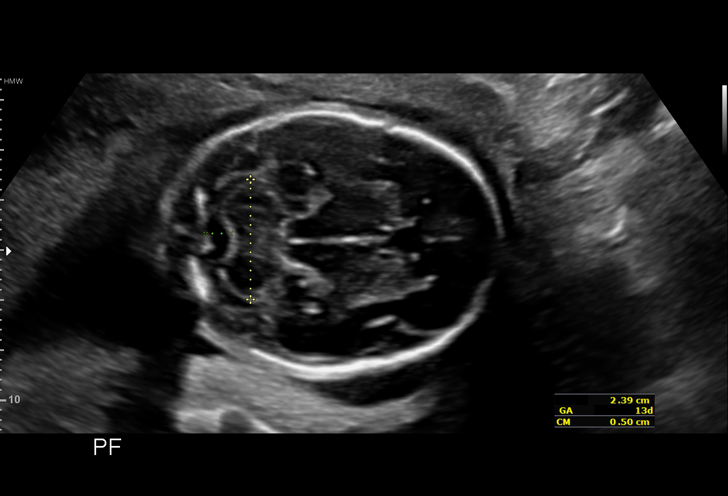
[im 48/87]
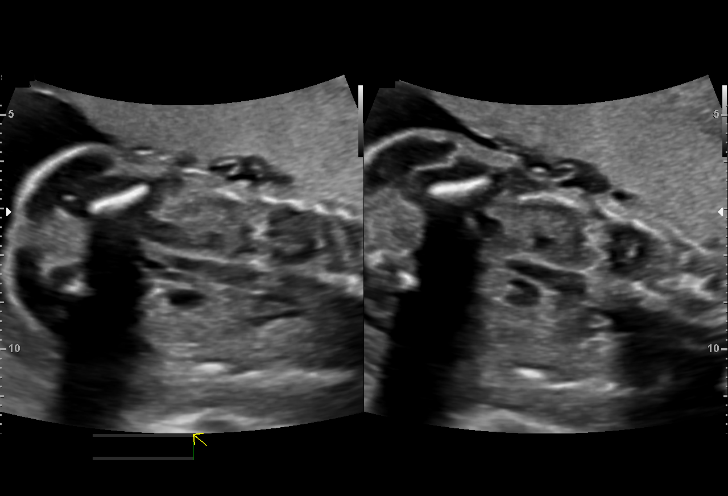
[im 55/87]
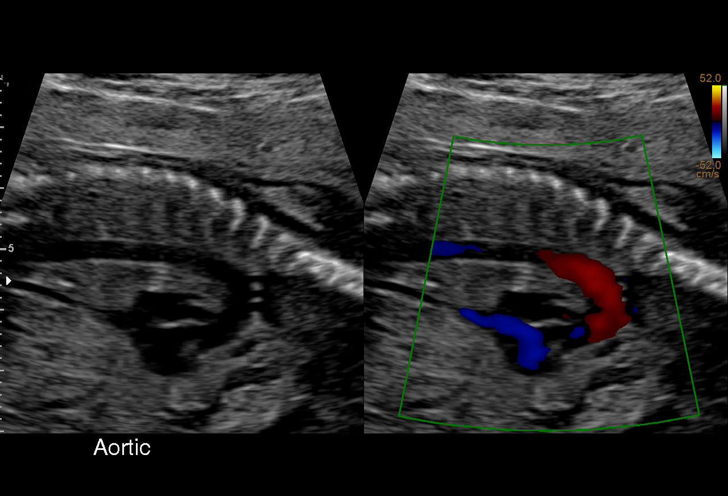
[im 61/87]
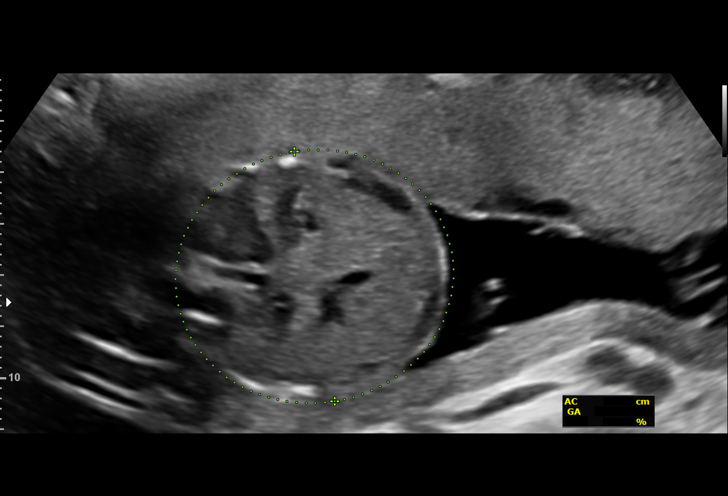
[im 71/87]
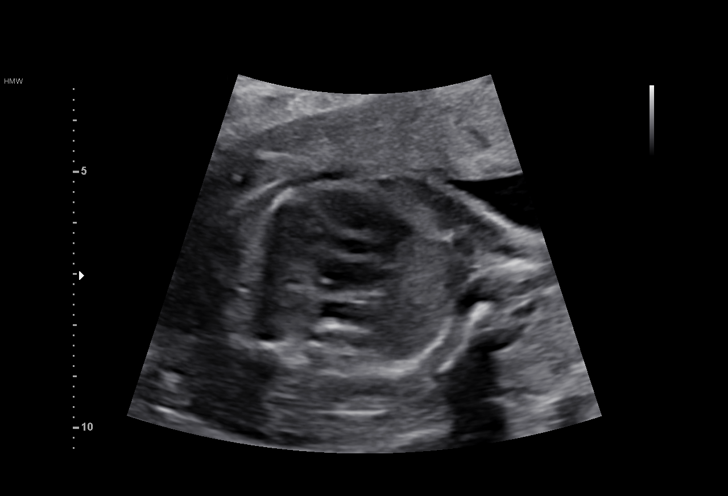
[im 77/87]
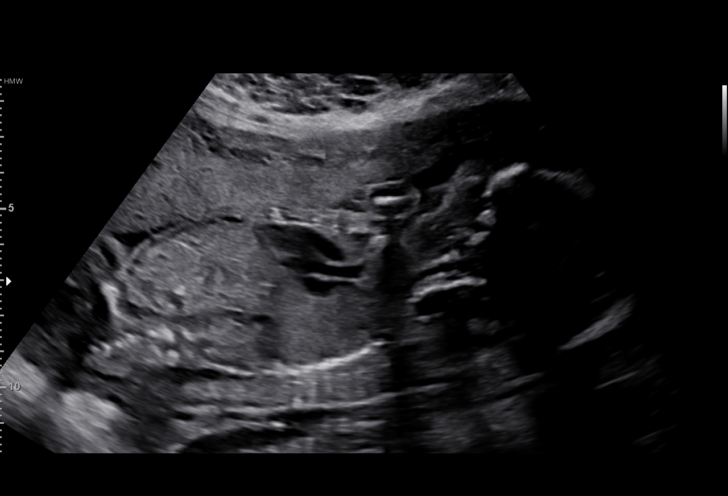
[im 83/87]
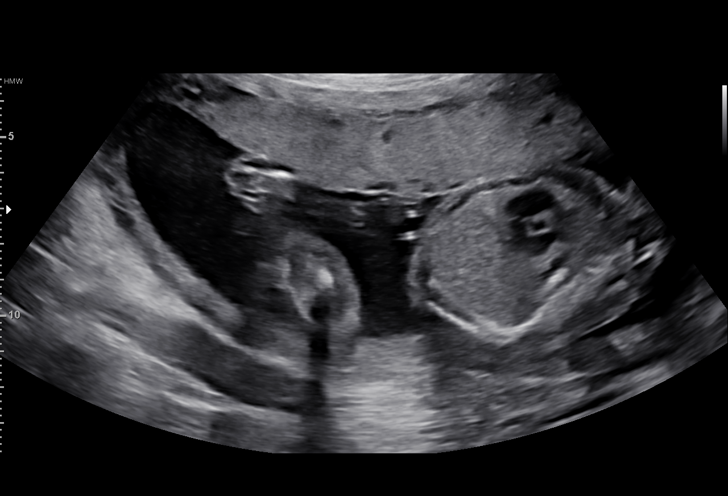

[12 of 28 positions shown; findings below may reference images not displayed]

[address]

                                                       DEAN[place]
 ----------------------------------------------------------------------

 ----------------------------------------------------------------------
Indications

  Advanced maternal age multigravida 36+,
  second trimester
  22 weeks gestation of pregnancy
  Encounter for antenatal screening for
  malformations (low risk NIPS)
  Poor obstetric history: Previous neonatal
  death
  Poor obstetric history: Previous preterm
  delivery, antepartum (90w7d 17w9d
  (neonatal demise)(neonatal demise result of
  kidney abnormalites)
  Previous cesarean delivery, antepartum x 1
  Obesity complicating pregnancy, second
  trimester
 ----------------------------------------------------------------------
Fetal Evaluation

 Num Of Fetuses:         1
 Fetal Heart Rate(bpm):  137
 Cardiac Activity:       Observed
 Presentation:           Cephalic
 Placenta:               Anterior
 P. Cord Insertion:      Visualized, central

 Amniotic Fluid
 AFI FV:      Within normal limits

                             Largest Pocket(cm)

Biometry

 BPD:      50.7  mm     G. Age:  21w 3d         19  %    CI:        71.32   %    70 - 86
                                                         FL/HC:      19.8   %    18.4 -
 HC:      191.2  mm     G. Age:  21w 3d         12  %
                                                         FL/BPD:     74.8   %    71 - 87
 FL:       37.9  mm     G. Age:  22w 1d         38  %
 HUM:      34.8  mm     G. Age:  21w 6d         43  %
 CER:      23.9  mm     G. Age:  22w 0d         46  %
 CM:          5  mm
OB History

 Gravidity:    6         Term:   0        Prem:   2        SAB:   3
 TOP:          0       Ectopic:  0        Living: 1
Gestational Age

 LMP:           22w 1d        Date:  11/12/18                 EDD:   08/19/19
 U/S Today:     21w 5d                                        EDD:   08/22/19
 Best:          22w 1d     Det. By:  LMP  (11/12/18)          EDD:   08/19/19
Anatomy

 Cranium:               Appears normal         LVOT:                   Appears normal
 Cavum:                 Appears normal         Aortic Arch:            Appears normal
 Ventricles:            Appears normal         Ductal Arch:            Not well visualized
 Choroid Plexus:        Appears normal         Diaphragm:              Appears normal
 Cerebellum:            Appears normal         Stomach:                Appears normal, left
                                                                       sided
 Posterior Fossa:       Appears normal         Abdomen:                Appears normal
 Nuchal Fold:           Not applicable (>20    Abdominal Wall:         Appears nml (cord
                        wks GA)                                        insert, abd wall)
 Face:                  Appears normal         Cord Vessels:           Appears normal (3
                        (orbits and profile)                           vessel cord)
 Lips:                  Appears normal         Kidneys:                Appear normal
 Palate:                Not well visualized    Bladder:                Appears normal
 Thoracic:              Appears normal         Spine:                  Ltd views no
                                                                       intracranial signs of
                                                                       NTD
 Heart:                 Appears normal         Upper Extremities:      Appears normal
                        (4CH, axis, and
                        situs)
 RVOT:                  Not well visualized    Lower Extremities:      Appears normal

 Other:  Heels visualized. 3VV visualized. Technically difficult due to fetal
         position. Hands not well visualized.
Cervix Uterus Adnexa

 Cervix
 Length:            3.1  cm.
 Normal appearance by transabdominal scan.

 Uterus
 No abnormality visualized.

 Left Ovary
 No adnexal mass visualized.

 Right Ovary
 No adnexal mass visualized.
 Cul De Sac
 No free fluid seen.

 Adnexa
 No abnormality visualized.
Comments

 This patient was seen for a detailed fetal anatomy scan due
 to advanced maternal age.  She reports a prior pregnancy
 that was complicated by a fetus with possible posterior
 urethral valves requiring a preterm delivery.  Unfortunately,
 that child did not survive.  She reports that her last delivery
 occurred at 36+ weeks.
 She denies any other significant past medical history and
 denies any problems in her current pregnancy.
 She had a cell free DNA test earlier in her pregnancy which
 indicated a low risk for trisomy 21, 18, and 13. A female fetus
 is predicted.
 She was informed that the fetal growth and amniotic fluid
 level were appropriate for her gestational age.
 There were no obvious fetal anomalies noted on today's
 ultrasound exam.  However, today's exam was limited due to
 the fetal position.
 The patient was informed that anomalies may be missed due
 to technical limitations. If the fetus is in a suboptimal position
 or maternal habitus is increased, visualization of the fetus in
 the maternal uterus may be impaired.
 The increased risk of fetal aneuploidy due to advanced
 maternal age was discussed. Due to advanced maternal age,
 the patient was offered and declined an amniocentesis today
 for definitive diagnosis of fetal aneuploidy.
 A follow-up exam was scheduled in 4 weeks to complete the
 views of the fetal anatomy.

## 2024-03-01 ENCOUNTER — Other Ambulatory Visit: Payer: Self-pay | Admitting: Medical Genetics
